# Patient Record
Sex: Male | Born: 1965
Health system: Southern US, Community
[De-identification: ages and names within clinical notes are randomized; demographics above are authoritative.]

## PROBLEM LIST (undated history)

## (undated) DIAGNOSIS — E785 Hyperlipidemia, unspecified: Secondary | ICD-10-CM

## (undated) DIAGNOSIS — D709 Neutropenia, unspecified: Secondary | ICD-10-CM

## (undated) HISTORY — DX: Neutropenia, unspecified: D70.9

## (undated) HISTORY — PX: COLONOSCOPY: SHX174

## (undated) HISTORY — DX: Hyperlipidemia, unspecified: E78.5

---

## 2011-01-23 ENCOUNTER — Other Ambulatory Visit: Payer: Self-pay | Admitting: Gastroenterology

## 2011-01-23 DIAGNOSIS — R1013 Epigastric pain: Secondary | ICD-10-CM

## 2011-02-12 ENCOUNTER — Ambulatory Visit
Admission: RE | Admit: 2011-02-12 | Discharge: 2011-02-12 | Disposition: A | Discharge status: Home / Self Care | Payer: 59 | Source: Ambulatory Visit | Attending: Gastroenterology | Admitting: Gastroenterology

## 2011-02-12 DIAGNOSIS — R1013 Epigastric pain: Secondary | ICD-10-CM

## 2011-07-27 ENCOUNTER — Other Ambulatory Visit: Payer: Self-pay | Admitting: Gastroenterology

## 2011-07-27 DIAGNOSIS — K769 Liver disease, unspecified: Secondary | ICD-10-CM

## 2011-07-29 ENCOUNTER — Other Ambulatory Visit: Payer: 59

## 2011-09-01 ENCOUNTER — Ambulatory Visit
Admission: RE | Admit: 2011-09-01 | Discharge: 2011-09-01 | Disposition: A | Discharge status: Home / Self Care | Payer: 59 | Source: Ambulatory Visit | Attending: Gastroenterology | Admitting: Gastroenterology

## 2011-09-01 DIAGNOSIS — K769 Liver disease, unspecified: Secondary | ICD-10-CM

## 2012-11-22 ENCOUNTER — Encounter: Payer: Self-pay | Admitting: Family Medicine

## 2012-11-22 ENCOUNTER — Ambulatory Visit (INDEPENDENT_AMBULATORY_CARE_PROVIDER_SITE_OTHER): Payer: 59 | Admitting: Family Medicine

## 2012-11-22 VITALS — Ht 76.0 in | Wt 171.0 lb

## 2012-11-22 DIAGNOSIS — F509 Eating disorder, unspecified: Secondary | ICD-10-CM

## 2012-11-22 NOTE — Progress Notes (Signed)
Medical Nutrition Therapy:  Appt start time: 1100 end time:  1200.  Assessment:  Primary concerns today: disordered eating.  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  He lost ~30 lb over the last couple of years, related to a relationship he was in with a woman who was anorexic.  He is no longer in that relationship, but retains many of the disordered eating behaviors and thoughts that he started having from that time.  He recognizes this is unhealthy, and he is unhappy with his preoccupation with food, which has started to affect his social life.   Usual eating pattern includes 3 meals (bkfst & lunch) and 1 snack per day. Usual physical activity includes bicycling 30 min to 3 hrs (20-50 mi) 6 X wk, 7-min high-intensity calisthenic workout, and yoga 2 X wk at [employer].   David Stuart's mom died in 2013/01/17and for weeks he had been staying awake thru much of the night.  He has not been able to get back on a regular sleep schedule.   Frequent foods include lentils, beans, tofu, unsweetened tea (mostly low-caffeine, decaf), fresh veg's ([employer]).  Avoided foods include flesh foods.    David Stuart is more focused on appearance than on weight.  He would like to weigh more than he is now, but doesn't have a # in mind.    24-hr recall: (Up at 1:30 AM) B (1:30 AM)-   3/4 c dry steel-cut oats, 1/4 c almonds, 1 apple, herb tea Back to bed till 6 AM Snk ( AM)-   tea L (12 PM)-  1 block tofu, 1 c lentils, 1/2 avocado, tea, water Snk ( PM)-  none D (4:30)-  1 block tofu, 1 c lentils, 1/2 avocado, water Snk (8:45)-  1/2 c dry popcorn, 2 apples, 1/4 c almonds  Progress Towards Goal(s):  In progress.   Nutritional Diagnosis:  NB-1.2 Harmful beliefs/attitudes about food or nutrition-related topics (use with caution) As related to nutritional value of foods.  As evidenced by plaguing thoughts, guilt, and food preoccupation.    Intervention:  Nutrition counseling.  Monitoring/Evaluation:  Dietary  intake, exercise, and body weight in 4 week(s).

## 2012-11-22 NOTE — Patient Instructions (Addendum)
-   Aim for no more than 5 hours between eating during the day.  - RDA for protein = 0.8 g per kg body weight.  171 lb / 2.2 = 78 g; 78 X 0.8 = 62 g.    - Recommend:  More than this if you are especially physically active, 65-75 g per day.    - Any time you exercise for more than an hour, you need some carb replacement.    - The best absorption of carb's is with a solution that is 6-10% carb.  (Divide total g of carb by total volume of drink, mL X 100).  - You also get the best absorption with DIFFERENT kinds of carb's as well as some protein (up to 15 g)   -Recommended protein:  Pro-stat.    - Consensus:  We can burn up to 60 g carb per hour.    - The best recovery nutrition is within 30 min of exercise.   - Journal some about those negative food thoughts.  Write down counter-arguments to those thoughts.   - AT FOLLOW-UP WE'LL TALK THESE THOUGHTS AND YOUR RESPONSE.    - Satiety & joy from eating are very important parts of being a "competent eater."  - Resources:  [employer].

## 2012-12-20 ENCOUNTER — Encounter: Payer: Self-pay | Admitting: Family Medicine

## 2012-12-20 ENCOUNTER — Ambulatory Visit (INDEPENDENT_AMBULATORY_CARE_PROVIDER_SITE_OTHER): Payer: 59 | Admitting: Family Medicine

## 2012-12-20 VITALS — Ht 76.0 in | Wt 169.7 lb

## 2012-12-20 DIAGNOSIS — F509 Eating disorder, unspecified: Secondary | ICD-10-CM

## 2012-12-20 NOTE — Progress Notes (Signed)
Medical Nutrition Therapy:  Appt start time: 1600 end time:  1700.  Assessment:  Primary concerns today: disordered eating.  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  He has been journaling about food thoughts and counter-arguments, which has been helpful to him in dealing with plaguing food thoughts or guilt about not exercising enough.  He has been trying to eat more meals with others to get away from isolating and to branch out in his food choices.  He has been trying to get protein as recommended at last visit; usually gets 60 g from 2 cakes of tofu/day plus additional protein in lentils and [place] yogurt.  Hayzen will not been able to ride as often as he'd like b/c of the school schedule and also b/c of fatigue related to still not sleeping well.  He has been averaging 2 hrs riding per day (1-3 hrs @ ~16 mph), but anticipates less now that school is in session.    24-hr recall:  (UP at 1 AM) B (1 AM)-  oatmeal, 1 c dry, 1 c plain [place] yogurt, 1/4 c walnuts, herb tea Back to bed ~2 AM till 5:30; walked the dog.  Snk (6 AM)-  Herb tea L ( PM)-  none Snk (4 PM)-  1 c lentils, steamed kale, baked eggplant & 1 cake tofu, avocado, water D (7 PM)-  1 c lentils, steamed kale, baked eggplant & 1 cake tofu, avocado, herb tea Snk (10 PM)-  4 tbsp kernels air-popped popcorn w/ salt & cumin, herb tea Kiegan has not usually been going 14 hours w/out eating, but yesterday was the first day of the semester, and he has not yet settled into a routine.  York Spaniel he just was busy all day, and did not think about food.    Progress Towards Goal(s):  In progress.   Nutritional Diagnosis:  NB-1.2 Harmful beliefs/attitudes about food or nutrition-related topics (use with caution) As related to nutritional value of foods.  As evidenced by plaguing thoughts, guilt, and food preoccupation.    Intervention:  Nutrition counseling.  Monitoring/Evaluation:  Dietary intake, exercise, and body  weight in 4 week(s).

## 2012-12-20 NOTE — Patient Instructions (Addendum)
-   Self-compassion.org:  Take the quiz.    - Email Jeannie your overall score and subscores.   - Include some sort of starch with each dinner:  winter squash, beets, sweet potato, white potatoes. - Getting your sleep regulated and consistent will help you regulate your eating.   - For any ride exercise >60 minutes, it's best to obtain at least 45 grams of carb per hour, and to follow this exercise with at least 15 grams of protein.   - Main goals for the next few weeks:  - Three meals and at least one snack per day with no more than 5 hours between eating.    - Track the number of hours of sleep per night, noting if it is interrupted or straight through.

## 2012-12-28 ENCOUNTER — Telehealth: Payer: Self-pay | Admitting: Internal Medicine

## 2012-12-28 NOTE — Telephone Encounter (Signed)
C/D 12/28/12 for appt. 01/19/13

## 2013-01-17 ENCOUNTER — Ambulatory Visit: Payer: 59 | Admitting: Family Medicine

## 2013-01-19 ENCOUNTER — Ambulatory Visit: Payer: 59

## 2013-02-07 ENCOUNTER — Other Ambulatory Visit: Payer: Self-pay | Admitting: Internal Medicine

## 2013-02-07 ENCOUNTER — Ambulatory Visit: Payer: 59

## 2013-02-07 ENCOUNTER — Telehealth: Payer: Self-pay

## 2013-02-07 NOTE — Telephone Encounter (Signed)
Pt will call back to reschedule appt

## 2013-02-08 ENCOUNTER — Telehealth: Payer: Self-pay | Admitting: Internal Medicine

## 2013-02-08 NOTE — Telephone Encounter (Signed)
Called referring office and notified of cancel np appts.

## 2013-02-14 ENCOUNTER — Ambulatory Visit: Payer: 59 | Admitting: Family Medicine

## 2013-03-14 ENCOUNTER — Ambulatory Visit: Payer: 59 | Admitting: Family Medicine

## 2015-11-18 DIAGNOSIS — F509 Eating disorder, unspecified: Secondary | ICD-10-CM | POA: Diagnosis not present

## 2015-11-18 DIAGNOSIS — Z0000 Encounter for general adult medical examination without abnormal findings: Secondary | ICD-10-CM | POA: Diagnosis not present

## 2015-11-18 DIAGNOSIS — Z8042 Family history of malignant neoplasm of prostate: Secondary | ICD-10-CM | POA: Diagnosis not present

## 2016-04-29 DIAGNOSIS — H524 Presbyopia: Secondary | ICD-10-CM | POA: Diagnosis not present

## 2016-04-29 DIAGNOSIS — H5203 Hypermetropia, bilateral: Secondary | ICD-10-CM | POA: Diagnosis not present

## 2016-06-05 DIAGNOSIS — Z23 Encounter for immunization: Secondary | ICD-10-CM | POA: Diagnosis not present

## 2017-05-07 DIAGNOSIS — Z0000 Encounter for general adult medical examination without abnormal findings: Secondary | ICD-10-CM | POA: Diagnosis not present

## 2017-05-25 DIAGNOSIS — Z0000 Encounter for general adult medical examination without abnormal findings: Secondary | ICD-10-CM | POA: Diagnosis not present

## 2017-05-25 DIAGNOSIS — Z13220 Encounter for screening for lipoid disorders: Secondary | ICD-10-CM | POA: Diagnosis not present

## 2017-07-06 ENCOUNTER — Encounter: Payer: Self-pay | Admitting: Oncology

## 2017-07-06 ENCOUNTER — Telehealth: Payer: Self-pay | Admitting: Oncology

## 2017-07-06 NOTE — Telephone Encounter (Signed)
Appt has been scheduled for the pt to see Dr. Clelia CroftShadad on 3/29 at 11am. Letter mailed to the pt with the appt date and time.

## 2017-07-23 ENCOUNTER — Encounter: Payer: Self-pay | Admitting: Oncology

## 2017-08-04 ENCOUNTER — Inpatient Hospital Stay: Payer: BLUE CROSS/BLUE SHIELD | Attending: Oncology | Admitting: Oncology

## 2017-08-04 ENCOUNTER — Inpatient Hospital Stay: Payer: BLUE CROSS/BLUE SHIELD

## 2017-08-04 VITALS — BP 109/67 | HR 48 | Temp 98.0°F | Resp 19 | Ht 76.0 in | Wt 177.2 lb

## 2017-08-04 DIAGNOSIS — D709 Neutropenia, unspecified: Secondary | ICD-10-CM | POA: Diagnosis not present

## 2017-08-04 DIAGNOSIS — D509 Iron deficiency anemia, unspecified: Secondary | ICD-10-CM

## 2017-08-04 DIAGNOSIS — D649 Anemia, unspecified: Secondary | ICD-10-CM

## 2017-08-04 DIAGNOSIS — Z800 Family history of malignant neoplasm of digestive organs: Secondary | ICD-10-CM | POA: Insufficient documentation

## 2017-08-04 LAB — CBC WITH DIFFERENTIAL (CANCER CENTER ONLY)
BASOS ABS: 0 10*3/uL (ref 0.0–0.1)
BASOS PCT: 0 %
Eosinophils Absolute: 0.2 10*3/uL (ref 0.0–0.5)
Eosinophils Relative: 7 %
HEMATOCRIT: 38.4 % (ref 38.4–49.9)
HEMOGLOBIN: 11.8 g/dL — AB (ref 13.0–17.1)
Lymphocytes Relative: 55 %
Lymphs Abs: 1.6 10*3/uL (ref 0.9–3.3)
MCH: 23.6 pg — ABNORMAL LOW (ref 27.2–33.4)
MCHC: 30.9 g/dL — ABNORMAL LOW (ref 32.0–36.0)
MCV: 76.5 fL — ABNORMAL LOW (ref 79.3–98.0)
Monocytes Absolute: 0.5 10*3/uL (ref 0.1–0.9)
Monocytes Relative: 16 %
NEUTROS ABS: 0.7 10*3/uL — AB (ref 1.5–6.5)
NEUTROS PCT: 22 %
Platelet Count: 195 10*3/uL (ref 140–400)
RBC: 5.01 MIL/uL (ref 4.20–5.82)
RDW: 14.5 % (ref 11.0–14.6)
WBC: 3 10*3/uL — AB (ref 4.0–10.3)

## 2017-08-04 LAB — CMP (CANCER CENTER ONLY)
ALBUMIN: 4 g/dL (ref 3.5–5.0)
ALK PHOS: 63 U/L (ref 40–150)
ALT: 25 U/L (ref 0–55)
AST: 57 U/L — ABNORMAL HIGH (ref 5–34)
Anion gap: 9 (ref 3–11)
BILIRUBIN TOTAL: 0.4 mg/dL (ref 0.2–1.2)
BUN: 19 mg/dL (ref 7–26)
CO2: 28 mmol/L (ref 22–29)
Calcium: 9.8 mg/dL (ref 8.4–10.4)
Chloride: 104 mmol/L (ref 98–109)
Creatinine: 0.79 mg/dL (ref 0.70–1.30)
GFR, Est AFR Am: >60 mL/min (ref >60)
GFR, Estimated: >60 mL/min (ref >60)
Glucose, Bld: 83 mg/dL (ref 70–140)
POTASSIUM: 4.8 mmol/L (ref 3.5–5.1)
Sodium: 141 mmol/L (ref 136–145)
TOTAL PROTEIN: 8.5 g/dL — AB (ref 6.4–8.3)

## 2017-08-04 LAB — IRON AND TIBC
Iron: 75 ug/dL (ref 45–182)
Saturation Ratios: 20 % (ref 17.9–39.5)
TIBC: 374 ug/dL (ref 250–450)
UIBC: 299 ug/dL

## 2017-08-04 LAB — VITAMIN B12: VITAMIN B 12: 165 pg/mL — AB (ref 180–914)

## 2017-08-04 LAB — FERRITIN: Ferritin: 43 ng/mL (ref 24–336)

## 2017-08-04 NOTE — Progress Notes (Signed)
Reason for Referral: Neutropenia  HPI: 52 year old gentleman native of [place] currently resides in [place].  He is a rather healthy gentleman without any significant comorbid conditions that has been told that he has neutropenia for the last 25 years.  He gets routine medical care at his primary care physician including periodic CBCs.  His last CBC done in January 2019 showed a white cell count of 2.3, hemoglobin 11.8 and MCV of 76.4.  His RDW was 14.2.  His neutrophil percentage was 18.6 with slightly increased lymphocytes at 54.  His chemistries including electrolytes, kidney function and liver function all within normal range.  Previous CBC done in 2014 showed a white cell count of 2.4, hemoglobin of 10.0 and MCV of 79.2.  His platelet count was 78 with a slightly decreased neutrophil count at 800.  He denies any symptoms related to this issue without any recurrent infections or hospitalizations.  He denies any constitutional symptoms or weight loss.  His performance status and activity level remain excellent.  He denies any joint pain or swelling.  He does not report any headaches, blurry vision, syncope or seizures. Does not report any fevers, chills or sweats.  Does not report any cough, wheezing or hemoptysis.  Does not report any chest pain, palpitation, orthopnea or leg edema.  Does not report any nausea, vomiting or abdominal pain.  Does not report any constipation or diarrhea.  Does not report any skeletal complaints.    Does not report frequency, urgency or hematuria.  Does not report any skin rashes or lesions. Does not report any heat or cold intolerance.  Does not report any lymphadenopathy or petechiae.  Does not report any anxiety or depression.  Remaining review of systems is negative.    His past medical history is unremarkable for hypertension, coronary disease or diabetes.   Current Outpatient Medications: He does not take any medication. .  .    No family history of  blood disorders.  His father had colon cancer.  Social History   Socioeconomic History  . Marital status: Unknown    Spouse name: Not on file  . Number of children: Not on file  . Years of education: Not on file  . Highest education level: Not on file  Occupational History  . Not on file  Social Needs  . Financial resource strain: Not on file  . Food insecurity:    Worry: Not on file    Inability: Not on file  . Transportation needs:    Medical: Not on file    Non-medical: Not on file  Tobacco Use  . Smoking status: Never Smoker  . Smokeless tobacco: Never Used  Substance and Sexual Activity  . Alcohol use: No  . Drug use: Not on file  . Sexual activity: Not on file  Lifestyle  . Physical activity:    Days per week: Not on file    Minutes per session: Not on file  . Stress: Not on file  Relationships  . Social connections:    Talks on phone: Not on file    Gets together: Not on file    Attends religious service: Not on file    Active member of club or organization: Not on file    Attends meetings of clubs or organizations: Not on file    Relationship status: Not on file  . Intimate partner violence:    Fear of current or ex partner: Not on file    Emotionally abused: Not on file  Physically abused: Not on file    Forced sexual activity: Not on file  Other Topics Concern  . Not on file  Social History Narrative  . Not on file  :  Pertinent items are noted in HPI.  Exam: Blood pressure 109/67, pulse (!) 48, temperature 98 F (36.7 C), temperature source Oral, resp. rate 19, height 6\' 4"  (1.93 m), weight 177 lb 3.2 oz (80.4 kg), SpO2 100 %.  ECOG 0 General appearance: alert and cooperative appeared without distress. Head: atraumatic without any abnormalities. Eyes: conjunctivae/corneas clear. PERRL.  Sclera anicteric. Throat: lips, mucosa, and tongue normal; without oral thrush or ulcers. Resp: clear to auscultation bilaterally without rhonchi, wheezes or  dullness to percussion. Cardio: regular rate and rhythm, S1, S2 normal, no murmur, click, rub or gallop GI: soft, non-tender; bowel sounds normal; no masses,  no organomegaly Skin: Skin color, texture, turgor normal. No rashes or lesions Lymph nodes: Cervical, supraclavicular, and axillary nodes normal. Neurologic: Grossly normal without any motor, sensory or deep tendon reflexes. Musculoskeletal: No joint deformity or effusion.    Assessment and Plan:   52 year old gentleman with the following issues:  1.  Neutropenia: This is been diagnosed for the last 25 years and has been fluctuating since that time.  The etiology is likely related to congenital causes versus benign fluctuating ethnic variation.  Hematological disorder is considered unlikely at this time given the chronicity and the mild nature of his neutropenia with an ANC that have ranged close to 1000.  Autoimmune disorder is also considered and likely.  I have recommended continued observation and surveillance for the time being and I will repeat his CBC as well as a peripheral smear today.  Flow cytometry can be used in the future if he has lymphocytosis.  2.  Microcytic anemia: He does report history of blood donations in the past although none recently.  I will obtain iron studies to ensure her iron adequacy.  He is up-to-date on his colonoscopies.  He does have family history of colon cancer.  3.  Follow-up: To be determined after his laboratory testing is back.  30  minutes was spent with the patient face-to-face today.  More than 50% of time was dedicated to patient counseling, education and answering questions regarding his diagnosis.

## 2017-09-19 ENCOUNTER — Emergency Department (HOSPITAL_COMMUNITY): Payer: BLUE CROSS/BLUE SHIELD

## 2017-09-19 ENCOUNTER — Encounter (HOSPITAL_COMMUNITY): Payer: Self-pay

## 2017-09-19 ENCOUNTER — Emergency Department (HOSPITAL_COMMUNITY)
Admission: EM | Admit: 2017-09-19 | Discharge: 2017-09-19 | Disposition: A | Discharge status: Home / Self Care | Payer: BLUE CROSS/BLUE SHIELD | Attending: Emergency Medicine | Admitting: Emergency Medicine

## 2017-09-19 ENCOUNTER — Other Ambulatory Visit: Payer: Self-pay

## 2017-09-19 DIAGNOSIS — R079 Chest pain, unspecified: Secondary | ICD-10-CM | POA: Diagnosis not present

## 2017-09-19 DIAGNOSIS — R0789 Other chest pain: Secondary | ICD-10-CM | POA: Diagnosis not present

## 2017-09-19 DIAGNOSIS — I213 ST elevation (STEMI) myocardial infarction of unspecified site: Secondary | ICD-10-CM | POA: Diagnosis not present

## 2017-09-19 LAB — BASIC METABOLIC PANEL
Anion gap: 7 (ref 5–15)
BUN: 16 mg/dL (ref 6–20)
CHLORIDE: 102 mmol/L (ref 101–111)
CO2: 28 mmol/L (ref 22–32)
Calcium: 9.4 mg/dL (ref 8.9–10.3)
Creatinine, Ser: 0.75 mg/dL (ref 0.61–1.24)
GFR calc Af Amer: >60 mL/min (ref >60)
GFR calc non Af Amer: >60 mL/min (ref >60)
Glucose, Bld: 92 mg/dL (ref 65–99)
POTASSIUM: 4.4 mmol/L (ref 3.5–5.1)
SODIUM: 137 mmol/L (ref 135–145)

## 2017-09-19 LAB — CBC
HEMATOCRIT: 39 % (ref 39.0–52.0)
HEMOGLOBIN: 11.9 g/dL — AB (ref 13.0–17.0)
MCH: 23.4 pg — ABNORMAL LOW (ref 26.0–34.0)
MCHC: 30.5 g/dL (ref 30.0–36.0)
MCV: 76.6 fL — AB (ref 78.0–100.0)
Platelets: 192 10*3/uL (ref 150–400)
RBC: 5.09 MIL/uL (ref 4.22–5.81)
RDW: 14.8 % (ref 11.5–15.5)
WBC: 3.1 10*3/uL — ABNORMAL LOW (ref 4.0–10.5)

## 2017-09-19 LAB — I-STAT TROPONIN, ED: Troponin i, poc: 0 ng/mL (ref 0.00–0.08)

## 2017-09-19 MED ORDER — GI COCKTAIL ~~LOC~~
30.0000 mL | Freq: Once | ORAL | Status: AC
Start: 2017-09-19 — End: 2017-09-19
  Administered 2017-09-19: 30 mL via ORAL
  Filled 2017-09-19: qty 30

## 2017-09-19 NOTE — ED Triage Notes (Signed)
Pt presents for evaluation of left sided CP x 2-3 days. Reports he initially thought it may be gas but has not resolved. Pt went to PCP after hours clinic today and had EKG was sent here for further evaluation.

## 2017-09-19 NOTE — ED Notes (Signed)
Dr. Sherrie Mustache did not call [facility], pt in lobby, stated, need to go back next

## 2017-09-19 NOTE — ED Provider Notes (Signed)
MSE was initiated and I personally evaluated the patient and placed orders (if any) at  12:15 PM on Sep 19, 2017.  The patient appears stable so that the remainder of the MSE may be completed by another provider. Patient has had intermittent left-sided anterior chest pain for the past 2 days. Pain is improved with standing. Not worse with walking. Patient also reports he rides his bicycle approximately 120 miles per week without ever having had any chest pain. He has no associated lightheadedness shortness of breath sweatiness or nausea with chest pain that he's had for the past 2 days. Chest discomfort is mild presently.He sought [facility] EKG was performed which was noted to be abnormal. Sent here for further evaluation.  On exam alert no distress lungs clear auscultation heart regular rate and rhythm no murmurs or rubs abdomen nondistended extremities without edema. Skin warm dry I showed today's EKG and EKG from clinic to Dr. Katrinka Blazing who is not convinced that EKG is consistent with STEMI. Symptoms are atypical for STEMI.   Doug Sou, MD 09/19/17 [phone]

## 2017-09-19 NOTE — ED Provider Notes (Signed)
[facility] EMERGENCY DEPARTMENT Provider Note  CSN: [id number] Arrival date & time: 09/19/17 1151  Chief Complaint(s) Chest Pain  HPI David Stuart is a 52 y.o. male healthy, athletic male who presents to the emergency department with intermittent left pectoral chest pressure that he first noted yesterday upon awakening.  Pain resolved once he got out of bed and went about his daily activity.  This included 2-1/2-hour bicycle ride.  There and this patient denied any recurrence of his pain.  He endorsed typical shortness of breath with his exercise but nothing out of the ordinary. Patient reports that this morning when he awoke the pain was there again which did improve mildly well going about his daily business. There was no associated nausea or vomiting.  No diaphoresis.  No abdominal pain.  Pain was not exertional.  It was nonradiating.    No history of hypertension or hyperlipidemia.  He is a non-smoker.  No illicit drug use.  No alcohol consumption.  Patient does report eating spicy food recently.  Denies any prior history of DVT.  No autoimmune disorders.  No recent travel or immobility.  HPI  Past Medical History History reviewed. No pertinent past medical history. Patient Active Problem List   Diagnosis Date Noted  . Eating disorder, unspecified 11/22/2012   Home Medication(s) Prior to Admission medications   Not on File                                                                                                                                    Past Surgical History History reviewed. No pertinent surgical history. Family History No family history on file.  Social History Social History   Tobacco Use  . Smoking status: Never Smoker  . Smokeless tobacco: Never Used  Substance Use Topics  . Alcohol use: No  . Drug use: Not on file   Allergies Patient has no known allergies.  Review of Systems Review of Systems All other systems are reviewed and are  negative for acute change except as noted in the HPI  Physical Exam Vital Signs  I have reviewed the triage vital signs BP 103/73   Pulse (!) 44   Temp 98.2 F (36.8 C) (Oral)   Resp 13   SpO2 100%   Physical Exam  Constitutional: He is oriented to person, place, and time. He appears well-developed and well-nourished. No distress.  Tall and thin  HENT:  Head: Normocephalic and atraumatic.  Nose: Nose normal.  Eyes: Pupils are equal, round, and reactive to light. Conjunctivae and EOM are normal. Right eye exhibits no discharge. Left eye exhibits no discharge. No scleral icterus.  Neck: Normal range of motion. Neck supple.  Cardiovascular: Regular rhythm. Bradycardia present. Exam reveals no gallop and no friction rub.  No murmur heard. Pulmonary/Chest: Effort normal and breath sounds normal. No stridor. No respiratory distress. He has no rales. He exhibits tenderness (mild).  Abdominal: Soft. He exhibits no distension. There is no tenderness.  Musculoskeletal: He exhibits no edema or tenderness.  Neurological: He is alert and oriented to person, place, and time.  Skin: Skin is warm and dry. No rash noted. He is not diaphoretic. No erythema.  Psychiatric: He has a normal mood and affect.  Vitals reviewed.   ED Results and Treatments Labs (all labs ordered are listed, but only abnormal results are displayed) Labs Reviewed  CBC - Abnormal; Notable for the following components:      Result Value   WBC 3.1 (*)    Hemoglobin 11.9 (*)    MCV 76.6 (*)    MCH 23.4 (*)    All other components within normal limits  BASIC METABOLIC PANEL  I-STAT TROPONIN, ED                                                                                                                         EKG  EKG Interpretation  Date/Time:  Sunday Sep 19 2017 11:57:29 EDT Ventricular Rate:  54 PR Interval:  196 QRS Duration: 88 QT Interval:  448 QTC Calculation: 424 R Axis:   74 Text  Interpretation:  Sinus bradycardia Possible Left atrial enlargement Left ventricular hypertrophy Nonspecific ST abnormality Abnormal ECG No old tracing to compare Reconfirmed by Drema Pry [phone]) on 09/19/2017 1:43:41 PM Also confirmed by Drema Pry [phone]), editor Barbette Hair [phone])  on 09/19/2017 3:49:08 PM      Radiology Dg Chest 2 View  Result Date: 09/19/2017 CLINICAL DATA:  Pt presents for evaluation of left sided CP x 2-3 days. Reports he initially thought it may be gas but has not resolved. Pt went to PCP after hours clinic today and had an abnormal EKG was sent here for further evaluation. EXAM: CHEST - 2 VIEW COMPARISON:  none FINDINGS: Lungs clear, mildly hyperinflated. Heart size and mediastinal contours are within normal limits. No effusion.  No pneumothorax. Visualized bones unremarkable. IMPRESSION: No acute cardiopulmonary disease. Electronically Signed   By: Corlis Leak M.D.   On: 09/19/2017 12:27   Pertinent labs & imaging results that were available during my care of the patient were reviewed by me and considered in my medical decision making (see chart for details).  Medications Ordered in ED Medications  gi cocktail (Maalox,Lidocaine,Donnatal) (30 mLs Oral Given 09/19/17 1530)  Procedures Procedures  (including critical care time)  Medical Decision Making / ED Course I have reviewed the nursing notes for this encounter and the patient's prior records (if available in EHR or on provided paperwork).    Highly atypical chest pain.  EKG with evidence of LVH.  Otherwise nonischemic without evidence of pericarditis.  Troponin negative.  I do not feel that this is cardiac in nature as the patient has gone through stress testing while cycling, causing no recurrence of his pain.  I am more suspicious for muscular strain versus GI  symptoms.  Low pretest probability for pulmonary embolism.  Presentation not classic for aortic dissection or esophageal perforation. Chest x-ray without evidence suggestive of pneumonia, pneumothorax, pneumomediastinum.  No abnormal contour of the mediastinum to suggest dissection. No evidence of acute injuries.  Patient provided with a GI cocktail resulting in complete resolution of his symptomatology.  The patient appears reasonably screened and/or stabilized for discharge and I doubt any other medical condition or other [facility] requiring further screening, evaluation, or treatment in the ED at this time prior to discharge.  The patient is safe for discharge with strict return precautions.   Final Clinical Impression(s) / ED Diagnoses Final diagnoses:  Atypical chest pain    Disposition: Discharge  Condition: Good  I have discussed the results, Dx and Tx plan with the patient who expressed understanding and agree(s) with the plan. Discharge instructions discussed at great length. The patient was given strict return precautions who verbalized understanding of the instructions. No further questions at time of discharge.    ED Discharge Orders    None       Follow Up: [name], FNP [address] [place] [address] [phone]  Schedule an appointment as soon as possible for a visit  As needed     This chart was dictated using voice recognition software.  Despite best efforts to proofread,  errors can occur which can change the documentation meaning.   Nira Conn, MD 09/19/17 [phone]

## 2017-11-14 DIAGNOSIS — J189 Pneumonia, unspecified organism: Secondary | ICD-10-CM | POA: Diagnosis not present

## 2017-11-14 DIAGNOSIS — R109 Unspecified abdominal pain: Secondary | ICD-10-CM | POA: Diagnosis not present

## 2017-11-14 DIAGNOSIS — R079 Chest pain, unspecified: Secondary | ICD-10-CM | POA: Diagnosis not present

## 2017-11-18 DIAGNOSIS — J181 Lobar pneumonia, unspecified organism: Secondary | ICD-10-CM | POA: Diagnosis not present

## 2017-12-12 DIAGNOSIS — R079 Chest pain, unspecified: Secondary | ICD-10-CM | POA: Diagnosis not present

## 2017-12-14 ENCOUNTER — Other Ambulatory Visit: Payer: Self-pay | Admitting: Family Medicine

## 2017-12-14 DIAGNOSIS — D709 Neutropenia, unspecified: Secondary | ICD-10-CM | POA: Diagnosis not present

## 2017-12-14 DIAGNOSIS — J189 Pneumonia, unspecified organism: Secondary | ICD-10-CM | POA: Diagnosis not present

## 2017-12-30 ENCOUNTER — Ambulatory Visit
Admission: RE | Admit: 2017-12-30 | Discharge: 2017-12-30 | Disposition: A | Discharge status: Home / Self Care | Payer: BLUE CROSS/BLUE SHIELD | Source: Ambulatory Visit | Attending: Family Medicine | Admitting: Family Medicine

## 2017-12-30 DIAGNOSIS — J189 Pneumonia, unspecified organism: Secondary | ICD-10-CM

## 2017-12-30 DIAGNOSIS — J181 Lobar pneumonia, unspecified organism: Secondary | ICD-10-CM | POA: Diagnosis not present

## 2017-12-31 ENCOUNTER — Ambulatory Visit
Admission: RE | Admit: 2017-12-31 | Discharge: 2017-12-31 | Disposition: A | Discharge status: Home / Self Care | Payer: BLUE CROSS/BLUE SHIELD | Source: Ambulatory Visit | Attending: Family Medicine | Admitting: Family Medicine

## 2017-12-31 ENCOUNTER — Other Ambulatory Visit: Payer: Self-pay

## 2017-12-31 ENCOUNTER — Encounter: Payer: Self-pay | Admitting: Radiology

## 2017-12-31 ENCOUNTER — Inpatient Hospital Stay (HOSPITAL_COMMUNITY)
Admission: EM | Admit: 2017-12-31 | Discharge: 2018-01-02 | DRG: 175 | Disposition: A | Discharge status: Home / Self Care | Payer: BLUE CROSS/BLUE SHIELD | Attending: Internal Medicine | Admitting: Internal Medicine

## 2017-12-31 ENCOUNTER — Other Ambulatory Visit: Payer: Self-pay | Admitting: Family Medicine

## 2017-12-31 DIAGNOSIS — Z820 Family history of epilepsy and other diseases of the nervous system: Secondary | ICD-10-CM | POA: Diagnosis not present

## 2017-12-31 DIAGNOSIS — Z8546 Personal history of malignant neoplasm of prostate: Secondary | ICD-10-CM

## 2017-12-31 DIAGNOSIS — J189 Pneumonia, unspecified organism: Secondary | ICD-10-CM | POA: Diagnosis present

## 2017-12-31 DIAGNOSIS — R9389 Abnormal findings on diagnostic imaging of other specified body structures: Secondary | ICD-10-CM

## 2017-12-31 DIAGNOSIS — Z8042 Family history of malignant neoplasm of prostate: Secondary | ICD-10-CM

## 2017-12-31 DIAGNOSIS — D649 Anemia, unspecified: Secondary | ICD-10-CM

## 2017-12-31 DIAGNOSIS — I2699 Other pulmonary embolism without acute cor pulmonale: Secondary | ICD-10-CM | POA: Diagnosis not present

## 2017-12-31 DIAGNOSIS — R0789 Other chest pain: Secondary | ICD-10-CM | POA: Diagnosis not present

## 2017-12-31 DIAGNOSIS — Z800 Family history of malignant neoplasm of digestive organs: Secondary | ICD-10-CM

## 2017-12-31 DIAGNOSIS — I82492 Acute embolism and thrombosis of other specified deep vein of left lower extremity: Secondary | ICD-10-CM | POA: Diagnosis present

## 2017-12-31 DIAGNOSIS — Z832 Family history of diseases of the blood and blood-forming organs and certain disorders involving the immune mechanism: Secondary | ICD-10-CM

## 2017-12-31 DIAGNOSIS — D528 Other folate deficiency anemias: Secondary | ICD-10-CM | POA: Diagnosis not present

## 2017-12-31 DIAGNOSIS — J181 Lobar pneumonia, unspecified organism: Secondary | ICD-10-CM | POA: Diagnosis not present

## 2017-12-31 DIAGNOSIS — I340 Nonrheumatic mitral (valve) insufficiency: Secondary | ICD-10-CM | POA: Diagnosis not present

## 2017-12-31 LAB — COMPREHENSIVE METABOLIC PANEL
ALBUMIN: 3.9 g/dL (ref 3.5–5.0)
ALK PHOS: 41 U/L (ref 38–126)
ALT: 18 U/L (ref 0–44)
ANION GAP: 11 (ref 5–15)
AST: 29 U/L (ref 15–41)
BUN: 15 mg/dL (ref 6–20)
CHLORIDE: 102 mmol/L (ref 98–111)
CO2: 25 mmol/L (ref 22–32)
Calcium: 9.5 mg/dL (ref 8.9–10.3)
Creatinine, Ser: 0.81 mg/dL (ref 0.61–1.24)
Glucose, Bld: 85 mg/dL (ref 70–99)
POTASSIUM: 4.4 mmol/L (ref 3.5–5.1)
Sodium: 138 mmol/L (ref 135–145)
Total Bilirubin: 0.6 mg/dL (ref 0.3–1.2)
Total Protein: 8.6 g/dL — ABNORMAL HIGH (ref 6.5–8.1)

## 2017-12-31 LAB — CBC
HCT: 36.6 % — ABNORMAL LOW (ref 39.0–52.0)
Hemoglobin: 11.1 g/dL — ABNORMAL LOW (ref 13.0–17.0)
MCH: 23 pg — AB (ref 26.0–34.0)
MCHC: 30.3 g/dL (ref 30.0–36.0)
MCV: 75.9 fL — ABNORMAL LOW (ref 78.0–100.0)
Platelets: 302 10*3/uL (ref 150–400)
RBC: 4.82 MIL/uL (ref 4.22–5.81)
RDW: 15.5 % (ref 11.5–15.5)
WBC: 3.8 10*3/uL — ABNORMAL LOW (ref 4.0–10.5)

## 2017-12-31 LAB — TROPONIN I

## 2017-12-31 LAB — ANTITHROMBIN III: ANTITHROMB III FUNC: 97 % (ref 75–120)

## 2017-12-31 LAB — I-STAT TROPONIN, ED: TROPONIN I, POC: 0.01 ng/mL (ref 0.00–0.08)

## 2017-12-31 MED ORDER — HEPARIN (PORCINE) IN NACL 100-0.45 UNIT/ML-% IJ SOLN
1300.0000 [IU]/h | INTRAMUSCULAR | Status: DC
  Administered 2017-12-31 – 2018-01-01 (×2): 1300 [IU]/h via INTRAVENOUS
  Filled 2017-12-31 (×2): qty 250

## 2017-12-31 MED ORDER — IOPAMIDOL (ISOVUE-370) INJECTION 76%
75.0000 mL | Freq: Once | INTRAVENOUS | Status: AC | PRN
Start: 2017-12-31 — End: 2017-12-31
  Administered 2017-12-31: 75 mL via INTRAVENOUS

## 2017-12-31 MED ORDER — ACETAMINOPHEN 650 MG RE SUPP: 650.0000 mg | Freq: Four times a day (QID) | RECTAL | Status: DC | PRN

## 2017-12-31 MED ORDER — HEPARIN BOLUS VIA INFUSION
5000.0000 [IU] | Freq: Once | INTRAVENOUS | Status: AC
  Administered 2017-12-31: 5000 [IU] via INTRAVENOUS
  Filled 2017-12-31: qty 5000

## 2017-12-31 MED ORDER — ACETAMINOPHEN 325 MG PO TABS: 650.0000 mg | ORAL_TABLET | Freq: Four times a day (QID) | ORAL | Status: DC | PRN

## 2017-12-31 MED ORDER — SODIUM CHLORIDE 0.9 % IV SOLN
INTRAVENOUS | Status: AC
  Administered 2017-12-31: via INTRAVENOUS

## 2017-12-31 NOTE — H&P (Signed)
TRH H&P   Patient Demographics:    David Stuart, is a 52 y.o. male  MRN: [id number]   DOB - 12/06/1965  Admit Date - 12/31/2017  Outpatient Primary MD for the patient is [name], FNP  Referring MD/NP/PA: Lyndel Safe  Outpatient Specialists:    Patient coming from: home  No chief complaint on file.     HPI:    David Stuart  is a 52 y.o. male, w + family hx of colon/ prostate cancer apparently c/o using levaquin in end of 10/2017 for pnemonia,and felt better  then pt had a repeat CXR which showed pneumonia and tx with cefdinir starting in 11/2017.  Pt denies recent travel or new medication or weight loss.   + sob. Denies fever, chills, cough, hemoptysis, palp, n/v, diarrhea, brbpr, black stool.   Since finishing abx, pt has felt right sided chest pain,  Had CT chest yesterday and had f/u of CTA chest today.    CTA chest IMPRESSION: 1. Convincing segmental pulmonary embolus to the superior segment of the right lower lobe, without associated lung consolidation. There is an equivocal small subsegmental branch pulmonary embolus adjacent to 1 of the areas of peripheral lung consolidation in the left lower lobe. There are no other pulmonary emboli. Given these limited embolic findings, the areas of lung consolidation described on the previous day's CT, are unlikely to reflect infarction. These are most likely multifocal areas of infection. 2. No new lung abnormalities.  Wbc 3.8, Hgb 11.1, Plt 302 Na 138, K 4.4, Bun 15, Creatinnie 0.81 Ast 29, Alt 18 Trop 0.01   Pt will be admitted for PE, and pneumonia.     Review of systems:    In addition to the HPI above, No Fever-chills, No Headache, No changes with Vision or hearing, No problems swallowing food or Liquids,  No Abdominal pain, No Nausea or Vommitting, Bowel movements are regular, No Blood in stool or  Urine, No dysuria, No new skin rashes or bruises, No new joints pains-aches,  No new weakness, tingling, numbness in any extremity, No recent weight gain or loss, No polyuria, polydypsia or polyphagia, No significant Mental Stressors.  A full 10 point Review of Systems was done, except as stated above, all other Review of Systems were negative.   With Past History of the following :    History reviewed. No pertinent past medical history.    Past Surgical History:  Procedure Laterality Date  . COLONOSCOPY        Social History:     Social History   Tobacco Use  . Smoking status: Never Smoker  . Smokeless tobacco: Never Used  Substance Use Topics  . Alcohol use: No     Lives - at home  Mobility - walks by self   Family History :     Family History  Problem Relation Age of Onset  .  Parkinson's disease Mother   . Lupus Mother   . Prostate cancer Father        died at age 103 from prostate cancer  . Colon cancer Father        Home Medications:   Prior to Admission medications   Medication Sig Start Date End Date Taking? Authorizing Provider  naproxen sodium (ALEVE) 220 MG tablet Take 220-440 mg by mouth 2 (two) times daily as needed (for pain).   Yes [provider]  ranitidine (ZANTAC) 75 MG tablet Take 75 mg by mouth daily as needed for heartburn.   Yes [provider]     Allergies:    No Known Allergies   Physical Exam:   Vitals  Blood pressure 111/75, pulse (!) 57, temperature 98.3 F (36.8 C), temperature source Oral, resp. rate 11, SpO2 100 %.   1. General   lying in bed in NAD,    2. Normal affect and insight, Not Suicidal or Homicidal, Awake Alert, Oriented X 3.  3. No F.N deficits, ALL C.Nerves Intact, Strength 5/5 all 4 extremities, Sensation intact all 4 extremities, Plantars down going.  4. Ears and Eyes appear Normal, Conjunctivae clear, PERRLA. Moist Oral Mucosa.  5. Supple Neck, No JVD, No cervical  lymphadenopathy appriciated, No Carotid Bruits.  6. Symmetrical Chest wall movement, slight crackles left lung base, no wheezing.   7. RRR, No Gallops, Rubs or Murmurs, No Parasternal Heave.  8. Positive Bowel Sounds, Abdomen Soft, No tenderness, No organomegaly appriciated,No rebound -guarding or rigidity.  9.  No Cyanosis, Normal Skin Turgor, No Skin Rash or Bruise.  10. Good muscle tone,  joints appear normal , no effusions, Normal ROM.  11. No Palpable Lymph Nodes in Neck or Axillae    EKG nsr at 70, nl axis, + lvh   Data Review:    CBC Recent Labs  Lab 12/31/17 1635  WBC 3.8*  HGB 11.1*  HCT 36.6*  PLT 302  MCV 75.9*  MCH 23.0*  MCHC 30.3  RDW 15.5   ------------------------------------------------------------------------------------------------------------------  Chemistries  Recent Labs  Lab 12/31/17 1635  NA 138  K 4.4  CL 102  CO2 25  GLUCOSE 85  BUN 15  CREATININE 0.81  CALCIUM 9.5  AST 29  ALT 18  ALKPHOS 41  BILITOT 0.6   ------------------------------------------------------------------------------------------------------------------ CrCl cannot be calculated (Unknown ideal weight.). ------------------------------------------------------------------------------------------------------------------ No results for input(s): TSH, T4TOTAL, T3FREE, THYROIDAB in the last 72 hours.  Invalid input(s): FREET3  Coagulation profile No results for input(s): INR, PROTIME in the last 168 hours. ------------------------------------------------------------------------------------------------------------------- No results for input(s): DDIMER in the last 72 hours. -------------------------------------------------------------------------------------------------------------------  Cardiac Enzymes No results for input(s): CKMB, TROPONINI, MYOGLOBIN in the last 168 hours.  Invalid input(s):  CK ------------------------------------------------------------------------------------------------------------------ No results found for: BNP   ---------------------------------------------------------------------------------------------------------------  Urinalysis No results found for: COLORURINE, APPEARANCEUR, LABSPEC, PHURINE, GLUCOSEU, HGBUR, BILIRUBINUR, KETONESUR, PROTEINUR, UROBILINOGEN, NITRITE, LEUKOCYTESUR  ----------------------------------------------------------------------------------------------------------------   Imaging Results:    Ct Chest Wo Contrast  Result Date: 12/30/2017 CLINICAL DATA:  Persistent pneumonias since July 2019. EXAM: CT CHEST WITHOUT CONTRAST TECHNIQUE: Multidetector CT imaging of the chest was performed following the standard protocol without IV contrast. COMPARISON:  Chest radiographs dated 12/12/2017, 11/14/2017 and 09/19/2017. FINDINGS: Cardiovascular: Coronary artery calcifications.  Normal sized heart. Mediastinum/Nodes: No enlarged mediastinal or axillary lymph nodes. Thyroid gland, trachea, and esophagus demonstrate no significant findings. Lungs/Pleura: Oval area dense peripheral consolidation and possible liquefaction in the posterolateral right lower lobe, measuring 5.1 x 2.0 cm on  image number 145 series 8. Similar area with some central gas or air in the posterolateral aspect of the left lower lobe measuring 4.0 x 2.4 cm on image number 148 series 8. Similar area posteromedially in the left lower lobe, measuring 4.4 x 0.9 cm on image number 152 series 8. Small similar area posteriorly at the left lung base in the left lower lobe on image number 158, measuring 1.2 x 0.6 cm. Area of probable subsegmental atelectasis in the medial aspect of the right middle lobe and similar area along the major fissure in the right lower lobe on image number 142 series 8. 4 mm right lower lobe nodule on image number 120 series 8. Upper Abdomen: Unremarkable.  Musculoskeletal: Unremarkable bones. IMPRESSION: 1. Oval area of dense consolidation and possible liquefaction in the posterolateral right lower lobe and 3 similar areas in the left lower lobe. These are concerning for areas of pulmonary infarction. Multifocal infection is also a possibility. These do not have the cavitation typically seen with septic emboli. Areas of rounded atelectasis are unlikely since these are multiple. Recommend a chest CTA to exclude pulmonary emboli. 2. 4 mm right lower lobe nodule. No follow-up needed if patient is low-risk. Non-contrast chest CT can be considered in 12 months if patient is high-risk. This recommendation follows the consensus statement: Guidelines for Management of Incidental Pulmonary Nodules Detected on CT Images: From the Fleischner Society 2017; Radiology 2017; 284:228-243. 3. Atheromatous coronary artery calcifications. These results will be called to the ordering clinician or representative by the Radiologist Assistant, and communication documented in the PACS or zVision Dashboard. Electronically Signed   By: Beckie Salts M.D.   On: 12/30/2017 17:32   Ct Angio Chest Pe W Or Wo Contrast  Result Date: 12/31/2017 CLINICAL DATA:  Abnormal CT yesterday. Follow-up exam. Questionable septic emboli. EXAM: CT ANGIOGRAPHY CHEST WITH CONTRAST TECHNIQUE: Multidetector CT imaging of the chest was performed using the standard protocol during bolus administration of intravenous contrast. Multiplanar CT image reconstructions and MIPs were obtained to evaluate the vascular anatomy. CONTRAST:  75mL ISOVUE-370 IOPAMIDOL (ISOVUE-370) INJECTION 76% COMPARISON:  CT of the chest on 12/30/2017 showing peripheral areas consolidation at both lung bases FINDINGS: Cardiovascular: There is satisfactory opacification of the pulmonary arteries to the segmental level. In a segmental branch to the right lower lobe, superior segment, there is occlusive thrombus, but no associated consolidation in  this portion of the lung. In a small subsegmental branch to the left lower lobe, just above the pleural based focus of left lower lobe consolidation, there is opacification of a peripheral pulmonary artery which could reflect a pulmonary embolism. There is no other evidence of a pulmonary embolus. Great vessels are normal in caliber. No aortic dissection or atherosclerosis. Minor left coronary artery calcifications. Heart is normal size.  No pericardial effusion. Mediastinum/Nodes: No enlarged mediastinal, hilar, or axillary lymph nodes. Thyroid gland, trachea, and esophagus demonstrate no significant findings. Lungs/Pleura: Peripheral areas of lower lobe lung consolidation are unchanged from the previous day's CT. Consolidation or scarring in the anterior inferior right middle lobe is stable. 4 mm right lower lobe nodule, image 104, series 11, also unchanged. No new areas of lung opacification. No evidence of pulmonary edema. No pleural effusion or pneumothorax. Upper Abdomen: No acute abnormality. Musculoskeletal: No chest wall abnormality. No acute or significant osseous findings. Review of the MIP images confirms the above findings. IMPRESSION: 1. Convincing segmental pulmonary embolus to the superior segment of the right lower lobe, without associated lung  consolidation. There is an equivocal small subsegmental branch pulmonary embolus adjacent to 1 of the areas of peripheral lung consolidation in the left lower lobe. There are no other pulmonary emboli. Given these limited embolic findings, the areas of lung consolidation described on the previous day's CT, are unlikely to reflect infarction. These are most likely multifocal areas of infection. 2. No new lung abnormalities. Electronically Signed   By: Amie Portland M.D.   On: 12/31/2017 15:24      Assessment & Plan:    Principal Problem:   Pulmonary embolism (HCC) Active Problems:   Community acquired pneumonia   Anemia    PE Tele Trop I q6h  x3 Check cardiac echo Heparin iv pharmacy to dose Check hypercoag panel Recommended routine screening for cancer as outpatient  CAP Blood culture x2 Sputum gram stain, culture Urine strep antigen, urine legionella antigen vanco iv, rocephin iv, zithromax iv Check cbc , cmp in am  Anemia Check cbc in am   DVT Prophylaxis Heparin -- SCDs   AM Labs Ordered, also please review Full Orders  Family Communication: Admission, patients condition and plan of care including tests being ordered have been discussed with the patient  who indicate understanding and agree with the plan and Code Status.  Code Status FULL CODE  Likely DC to  home  Condition GUARDED    Consults called: none  Admission status: inpatient   Time spent in minutes : 32   Pearson Grippe M.D on 12/31/2017 at 7:41 PM  Between 7am to 7pm - Pager - [phone]  After 7pm go to [email or web address] - password [email or web address]  [facility] - Office  [phone]

## 2017-12-31 NOTE — ED Triage Notes (Signed)
Patient reports being sent after CT confirming PE of the right lower lobe. Pt states he was placed on antibiotics since July after diagnosed with pneumonia. No difficulty breathing today, only pain is described as "gas pain."

## 2017-12-31 NOTE — ED Provider Notes (Signed)
Patient placed in Quick Look pathway, seen and evaluated   Chief Complaint:Positive PE scan.   HPI:   Patient has been having breathing difficulties since July.   He is here today with a positive PE scan which also shows multi focal pneumonia.  He denies CP or ShOB.  Has bilateral upper abdomen "gas pain"  ROS: No fever or chills.   Physical Exam:   Gen: No distress  Neuro: Awake and Alert  Skin: Warm    Focused Exam: No respiratory distress at baseline.    Ct Chest Wo Contrast  Result Date: 12/30/2017 CLINICAL DATA:  Persistent pneumonias since July 2019. EXAM: CT CHEST WITHOUT CONTRAST TECHNIQUE: Multidetector CT imaging of the chest was performed following the standard protocol without IV contrast. COMPARISON:  Chest radiographs dated 12/12/2017, 11/14/2017 and 09/19/2017. FINDINGS: Cardiovascular: Coronary artery calcifications.  Normal sized heart. Mediastinum/Nodes: No enlarged mediastinal or axillary lymph nodes. Thyroid gland, trachea, and esophagus demonstrate no significant findings. Lungs/Pleura: Oval area dense peripheral consolidation and possible liquefaction in the posterolateral right lower lobe, measuring 5.1 x 2.0 cm on image number 145 series 8. Similar area with some central gas or air in the posterolateral aspect of the left lower lobe measuring 4.0 x 2.4 cm on image number 148 series 8. Similar area posteromedially in the left lower lobe, measuring 4.4 x 0.9 cm on image number 152 series 8. Small similar area posteriorly at the left lung base in the left lower lobe on image number 158, measuring 1.2 x 0.6 cm. Area of probable subsegmental atelectasis in the medial aspect of the right middle lobe and similar area along the major fissure in the right lower lobe on image number 142 series 8. 4 mm right lower lobe nodule on image number 120 series 8. Upper Abdomen: Unremarkable. Musculoskeletal: Unremarkable bones. IMPRESSION: 1. Oval area of dense consolidation and possible  liquefaction in the posterolateral right lower lobe and 3 similar areas in the left lower lobe. These are concerning for areas of pulmonary infarction. Multifocal infection is also a possibility. These do not have the cavitation typically seen with septic emboli. Areas of rounded atelectasis are unlikely since these are multiple. Recommend a chest CTA to exclude pulmonary emboli. 2. 4 mm right lower lobe nodule. No follow-up needed if patient is low-risk. Non-contrast chest CT can be considered in 12 months if patient is high-risk. This recommendation follows the consensus statement: Guidelines for Management of Incidental Pulmonary Nodules Detected on CT Images: From the Fleischner Society 2017; Radiology 2017; 284:228-243. 3. Atheromatous coronary artery calcifications. These results will be called to the ordering clinician or representative by the Radiologist Assistant, and communication documented in the PACS or zVision Dashboard. Electronically Signed   By: Beckie Salts M.D.   On: 12/30/2017 17:32   Ct Angio Chest Pe W Or Wo Contrast  Result Date: 12/31/2017 CLINICAL DATA:  Abnormal CT yesterday. Follow-up exam. Questionable septic emboli. EXAM: CT ANGIOGRAPHY CHEST WITH CONTRAST TECHNIQUE: Multidetector CT imaging of the chest was performed using the standard protocol during bolus administration of intravenous contrast. Multiplanar CT image reconstructions and MIPs were obtained to evaluate the vascular anatomy. CONTRAST:  75mL ISOVUE-370 IOPAMIDOL (ISOVUE-370) INJECTION 76% COMPARISON:  CT of the chest on 12/30/2017 showing peripheral areas consolidation at both lung bases FINDINGS: Cardiovascular: There is satisfactory opacification of the pulmonary arteries to the segmental level. In a segmental branch to the right lower lobe, superior segment, there is occlusive thrombus, but no associated consolidation in this portion of  the lung. In a small subsegmental branch to the left lower lobe, just above the  pleural based focus of left lower lobe consolidation, there is opacification of a peripheral pulmonary artery which could reflect a pulmonary embolism. There is no other evidence of a pulmonary embolus. Great vessels are normal in caliber. No aortic dissection or atherosclerosis. Minor left coronary artery calcifications. Heart is normal size.  No pericardial effusion. Mediastinum/Nodes: No enlarged mediastinal, hilar, or axillary lymph nodes. Thyroid gland, trachea, and esophagus demonstrate no significant findings. Lungs/Pleura: Peripheral areas of lower lobe lung consolidation are unchanged from the previous day's CT. Consolidation or scarring in the anterior inferior right middle lobe is stable. 4 mm right lower lobe nodule, image 104, series 11, also unchanged. No new areas of lung opacification. No evidence of pulmonary edema. No pleural effusion or pneumothorax. Upper Abdomen: No acute abnormality. Musculoskeletal: No chest wall abnormality. No acute or significant osseous findings. Review of the MIP images confirms the above findings. IMPRESSION: 1. Convincing segmental pulmonary embolus to the superior segment of the right lower lobe, without associated lung consolidation. There is an equivocal small subsegmental branch pulmonary embolus adjacent to 1 of the areas of peripheral lung consolidation in the left lower lobe. There are no other pulmonary emboli. Given these limited embolic findings, the areas of lung consolidation described on the previous day's CT, are unlikely to reflect infarction. These are most likely multifocal areas of infection. 2. No new lung abnormalities. Electronically Signed   By: Amie Portland M.D.   On: 12/31/2017 15:24     Initiation of care has begun. The patient has been counseled on the process, plan, and necessity for staying for the completion/evaluation, and the remainder of the medical screening examination    Norman Clay 12/31/17 1638    Margarita Grizzle, MD 01/03/18 2120

## 2017-12-31 NOTE — ED Provider Notes (Signed)
Emergency Department Provider Note   I have reviewed the triage vital signs and the nursing notes.   HISTORY  Chief Complaint No chief complaint on file.   HPI David Stuart is a 52 y.o. male presents to the emergency department for evaluation of intermittent chest pain and presumed infection over the past 2 months.  Patient states his symptoms began in July with the right lateral chest wall pain.  He completed 2 courses of 5-day Levaquin symptoms did not completely resolved.  He then developed left lateral chest wall pain and was started on Cefdinir.  He completed approximately 2 weeks of this, again with no significant relief in symptoms.  His PCP sent him for a noncontrast CT yesterday which showed concern for possible lung infarction.  He was sent back for CT [facility] today which showed two areas of PE with some associated infectious process concern.  The patient has not had fevers and chills.  He has had some generalized weakness.  He is not experiencing any shortness of breath with his chest pain.  No syncope.  No recent [place] trips, surgeries.  No family history of hypercoagulable disorders.   History reviewed. No pertinent past medical history.  Patient Active Problem List   Diagnosis Date Noted  . Pulmonary embolism (HCC) 12/31/2017  . Community acquired pneumonia 12/31/2017  . Anemia 12/31/2017  . Eating disorder, unspecified 11/22/2012    Past Surgical History:  Procedure Laterality Date  . COLONOSCOPY        Allergies Patient has no known allergies.  Family History  Problem Relation Age of Onset  . Parkinson's disease Mother   . Lupus Mother   . Prostate cancer Father        died at age 53 from prostate cancer  . Colon cancer Father     Social History Social History   Tobacco Use  . Smoking status: Never Smoker  . Smokeless tobacco: Never Used  Substance Use Topics  . Alcohol use: No  . Drug use: Not on file    Review of Systems  Constitutional: No  fever/chills Eyes: No visual changes. ENT: No sore throat. Cardiovascular: Positive chest pain. Respiratory: Denies shortness of breath. Gastrointestinal: No abdominal pain.  No nausea, no vomiting.  No diarrhea.  No constipation. Genitourinary: Negative for dysuria. Musculoskeletal: Negative for back pain. Skin: Negative for rash. Neurological: Negative for headaches, focal weakness or numbness.  10-point ROS otherwise negative.  ____________________________________________   PHYSICAL EXAM:  VITAL SIGNS: ED Triage Vitals  Enc Vitals Group     BP 12/31/17 1623 111/75     Pulse Rate 12/31/17 1623 82     Resp 12/31/17 1623 16     Temp 12/31/17 1623 98.3 F (36.8 C)     Temp Source 12/31/17 1623 Oral     SpO2 12/31/17 1623 100 %   Constitutional: Alert and oriented. Well appearing and in no acute distress. Eyes: Conjunctivae are normal.  Head: Atraumatic. Nose: No congestion/rhinnorhea. Mouth/Throat: Mucous membranes are moist.  Neck: No stridor.  Cardiovascular: Normal rate, regular rhythm. Good peripheral circulation. Grossly normal heart sounds.   Respiratory: Normal respiratory effort.  No retractions. Lungs CTAB. Gastrointestinal: Soft and nontender. No distention.  Musculoskeletal: No lower extremity tenderness nor edema. No gross deformities of extremities. Neurologic:  Normal speech and language. No gross focal neurologic deficits are appreciated.  Skin:  Skin is warm, dry and intact. No rash noted.  ____________________________________________   LABS (all labs ordered are listed, but only  abnormal results are displayed)  Labs Reviewed  CBC - Abnormal; Notable for the following components:      Result Value   WBC 3.8 (*)    Hemoglobin 11.1 (*)    HCT 36.6 (*)    MCV 75.9 (*)    MCH 23.0 (*)    All other components within normal limits  COMPREHENSIVE METABOLIC PANEL - Abnormal; Notable for the following components:   Total Protein 8.6 (*)    All other  components within normal limits  MRSA PCR SCREENING  ANTITHROMBIN III  TROPONIN I  PROTEIN C ACTIVITY  PROTEIN C, TOTAL  PROTEIN S ACTIVITY  PROTEIN S, TOTAL  LUPUS ANTICOAGULANT PANEL  BETA-2-GLYCOPROTEIN I ABS, IGG/M/A  HOMOCYSTEINE  FACTOR 5 LEIDEN  PROTHROMBIN GENE MUTATION  CARDIOLIPIN ANTIBODIES, IGG, IGM, IGA  HEPARIN LEVEL (UNFRACTIONATED)  CBC  HIV ANTIBODY (ROUTINE TESTING)  COMPREHENSIVE METABOLIC PANEL  TROPONIN I  TROPONIN I  I-STAT TROPONIN, ED   ____________________________________________  EKG   EKG Interpretation  Date/Time:  Friday December 31 2017 16:21:16 EDT Ventricular Rate:  72 PR Interval:  162 QRS Duration: 90 QT Interval:  406 QTC Calculation: 444 R Axis:   77 Text Interpretation:  Normal sinus rhythm with sinus arrhythmia Right atrial enlargement Moderate voltage criteria for LVH, may be normal variant Borderline ECG No STEMI.  Confirmed by Alona Bene [id number]) on 12/31/2017 6:42:40 PM       ____________________________________________  RADIOLOGY  Ct Angio Chest Pe W Or Wo Contrast  Result Date: 12/31/2017 CLINICAL DATA:  Abnormal CT yesterday. Follow-up exam. Questionable septic emboli. EXAM: CT ANGIOGRAPHY CHEST WITH CONTRAST TECHNIQUE: Multidetector CT imaging of the chest was performed using the standard protocol during bolus administration of intravenous contrast. Multiplanar CT image reconstructions and MIPs were obtained to evaluate the vascular anatomy. CONTRAST:  75mL ISOVUE-370 IOPAMIDOL (ISOVUE-370) INJECTION 76% COMPARISON:  CT of the chest on 12/30/2017 showing peripheral areas consolidation at both lung bases FINDINGS: Cardiovascular: There is satisfactory opacification of the pulmonary arteries to the segmental level. In a segmental branch to the right lower lobe, superior segment, there is occlusive thrombus, but no associated consolidation in this portion of the lung. In a small subsegmental branch to the left lower lobe, just  above the pleural based focus of left lower lobe consolidation, there is opacification of a peripheral pulmonary artery which could reflect a pulmonary embolism. There is no other evidence of a pulmonary embolus. Great vessels are normal in caliber. No aortic dissection or atherosclerosis. Minor left coronary artery calcifications. Heart is normal size.  No pericardial effusion. Mediastinum/Nodes: No enlarged mediastinal, hilar, or axillary lymph nodes. Thyroid gland, trachea, and esophagus demonstrate no significant findings. Lungs/Pleura: Peripheral areas of lower lobe lung consolidation are unchanged from the previous day's CT. Consolidation or scarring in the anterior inferior right middle lobe is stable. 4 mm right lower lobe nodule, image 104, series 11, also unchanged. No new areas of lung opacification. No evidence of pulmonary edema. No pleural effusion or pneumothorax. Upper Abdomen: No acute abnormality. Musculoskeletal: No chest wall abnormality. No acute or significant osseous findings. Review of the MIP images confirms the above findings. IMPRESSION: 1. Convincing segmental pulmonary embolus to the superior segment of the right lower lobe, without associated lung consolidation. There is an equivocal small subsegmental branch pulmonary embolus adjacent to 1 of the areas of peripheral lung consolidation in the left lower lobe. There are no other pulmonary emboli. Given these limited embolic findings, the areas of lung consolidation  described on the previous day's CT, are unlikely to reflect infarction. These are most likely multifocal areas of infection. 2. No new lung abnormalities. Electronically Signed   By: Amie Portland M.D.   On: 12/31/2017 15:24    ____________________________________________   PROCEDURES  Procedure(s) performed:   Procedures  CRITICAL CARE Performed by: Maia Plan Total critical care time: 35 minutes Critical care time was exclusive of separately billable  procedures and treating other patients. Critical care was necessary to treat or prevent imminent or life-threatening deterioration. Critical care was time spent personally by me on the following activities: development of treatment plan with patient and/or surrogate as well as nursing, discussions with consultants, evaluation of patient's response to treatment, examination of patient, obtaining history from patient or surrogate, ordering and performing treatments and interventions, ordering and review of laboratory studies, ordering and review of radiographic studies, pulse oximetry and re-evaluation of patient's condition.  Alona Bene, MD Emergency Medicine  ____________________________________________   INITIAL IMPRESSION / ASSESSMENT AND PLAN / ED COURSE  Pertinent labs & imaging results that were available during my care of the patient were reviewed by me and considered in my medical decision making (see chart for details).  Patient presents to the ED for evaluation of positive CTA for PE. Patient with no SOB or fever symptoms. Starting heparin and will admit. Hold abx for now.   Discussed patient's case with Hospitalist, Dr. Selena Batten to request admission. Patient and family (if present) updated with plan. Care transferred to Hospitalist service.  I reviewed all nursing notes, vitals, pertinent old records, EKGs, labs, imaging (as available).  ____________________________________________  FINAL CLINICAL IMPRESSION(S) / ED DIAGNOSES  Final diagnoses:  Acute pulmonary embolism without acute cor pulmonale, unspecified pulmonary embolism type (HCC)     MEDICATIONS GIVEN DURING THIS VISIT:  Medications  heparin ADULT infusion 100 units/mL (25000 units/261mL sodium chloride 0.45%) (1,300 Units/hr Intravenous New Bag/Given 12/31/17 2000)  0.9 %  sodium chloride infusion ( Intravenous New Bag/Given 12/31/17 2354)  acetaminophen (TYLENOL) tablet 650 mg (has no administration in time range)     Or  acetaminophen (TYLENOL) suppository 650 mg (has no administration in time range)  heparin bolus via infusion 5,000 Units (5,000 Units Intravenous Bolus from Bag 12/31/17 2000)    Note:  This document was prepared using Dragon voice recognition software and may include unintentional dictation errors.  Alona Bene, MD Emergency Medicine    Sharin Altidor, Arlyss Repress, MD 01/01/18 [id number]

## 2017-12-31 NOTE — Progress Notes (Signed)
ANTICOAGULATION CONSULT NOTE - Initial Consult  Pharmacy Consult for heparin Indication: pulmonary embolus  No Known Allergies  Patient Measurements:  Actual body weight: 83 kg (patient reported) Ideal body weight: 86.8 kg  Heparin Dosing Weight: 83 kg   Vital Signs: Temp: 98.3 F (36.8 C) (09/06 1623) Temp Source: Oral (09/06 1623) BP: 111/75 (09/06 1900) Pulse Rate: 57 (09/06 1900)  Labs: Recent Labs    12/31/17 1635  HGB 11.1*  HCT 36.6*  PLT 302  CREATININE 0.81    CrCl cannot be calculated (Unknown ideal weight.).   Medical History: History reviewed. No pertinent past medical history.  Medications:   (Not in a hospital admission)  Assessment: 33 YOM with new segmental PE in R lower lobe to start IV heparin. He is not on any anticoagulation prior to admission. CBC reviewed.   Goal of Therapy:  Heparin level 0.3-0.7 units/ml Monitor platelets by anticoagulation protocol: Yes   Plan:  -Heparin 5000 units IV bolus, then start heparin infusion at 1300 units/hr -F/u 6 hr HL -Monitor daily HL, CBC and s/s of bleeding  Vinnie Level, PharmD., BCPS Clinical Pharmacist Clinical phone for 12/31/17 until 11pm: [phone]

## 2018-01-01 ENCOUNTER — Inpatient Hospital Stay (HOSPITAL_COMMUNITY): Payer: BLUE CROSS/BLUE SHIELD

## 2018-01-01 ENCOUNTER — Other Ambulatory Visit (HOSPITAL_COMMUNITY): Payer: BLUE CROSS/BLUE SHIELD

## 2018-01-01 DIAGNOSIS — I2699 Other pulmonary embolism without acute cor pulmonale: Secondary | ICD-10-CM

## 2018-01-01 DIAGNOSIS — I340 Nonrheumatic mitral (valve) insufficiency: Secondary | ICD-10-CM

## 2018-01-01 DIAGNOSIS — J181 Lobar pneumonia, unspecified organism: Secondary | ICD-10-CM

## 2018-01-01 DIAGNOSIS — J189 Pneumonia, unspecified organism: Secondary | ICD-10-CM | POA: Diagnosis present

## 2018-01-01 LAB — COMPREHENSIVE METABOLIC PANEL
ALBUMIN: 3.4 g/dL — AB (ref 3.5–5.0)
ALT: 17 U/L (ref 0–44)
AST: 27 U/L (ref 15–41)
Alkaline Phosphatase: 40 U/L (ref 38–126)
Anion gap: 8 (ref 5–15)
BILIRUBIN TOTAL: 0.5 mg/dL (ref 0.3–1.2)
BUN: 13 mg/dL (ref 6–20)
CO2: 27 mmol/L (ref 22–32)
Calcium: 8.9 mg/dL (ref 8.9–10.3)
Chloride: 104 mmol/L (ref 98–111)
Creatinine, Ser: 0.79 mg/dL (ref 0.61–1.24)
GFR calc Af Amer: >60 mL/min (ref >60)
GFR calc non Af Amer: >60 mL/min (ref >60)
GLUCOSE: 89 mg/dL (ref 70–99)
Potassium: 4.1 mmol/L (ref 3.5–5.1)
Sodium: 139 mmol/L (ref 135–145)
TOTAL PROTEIN: 7.4 g/dL (ref 6.5–8.1)

## 2018-01-01 LAB — TROPONIN I
Troponin I: <0.03 ng/mL (ref <0.03)
Troponin I: <0.03 ng/mL (ref <0.03)

## 2018-01-01 LAB — CBC
HCT: 33.6 % — ABNORMAL LOW (ref 39.0–52.0)
Hemoglobin: 10.3 g/dL — ABNORMAL LOW (ref 13.0–17.0)
MCH: 22.9 pg — ABNORMAL LOW (ref 26.0–34.0)
MCHC: 30.7 g/dL (ref 30.0–36.0)
MCV: 74.7 fL — ABNORMAL LOW (ref 78.0–100.0)
Platelets: 281 10*3/uL (ref 150–400)
RBC: 4.5 MIL/uL (ref 4.22–5.81)
RDW: 15.4 % (ref 11.5–15.5)
WBC: 4.2 10*3/uL (ref 4.0–10.5)

## 2018-01-01 LAB — ECHOCARDIOGRAM COMPLETE
Height: 76 in
Weight: 2895.96 oz

## 2018-01-01 LAB — MRSA PCR SCREENING: MRSA BY PCR: NEGATIVE

## 2018-01-01 LAB — HIV ANTIBODY (ROUTINE TESTING W REFLEX): HIV Screen 4th Generation wRfx: NONREACTIVE

## 2018-01-01 LAB — HEPARIN LEVEL (UNFRACTIONATED)
HEPARIN UNFRACTIONATED: 0.49 [IU]/mL (ref 0.30–0.70)
Heparin Unfractionated: 0.66 IU/mL (ref 0.30–0.70)

## 2018-01-01 MED ORDER — VANCOMYCIN HCL 10 G IV SOLR
1500.0000 mg | Freq: Once | INTRAVENOUS | Status: DC
  Administered 2018-01-01: 1500 mg via INTRAVENOUS
  Filled 2018-01-01: qty 1500

## 2018-01-01 MED ORDER — LEVOFLOXACIN 750 MG PO TABS
750.0000 mg | ORAL_TABLET | Freq: Every day | ORAL | Status: DC
  Administered 2018-01-01 – 2018-01-02 (×2): 750 mg via ORAL
  Filled 2018-01-01 (×2): qty 1

## 2018-01-01 MED ORDER — FAMOTIDINE 20 MG PO TABS: 20.0000 mg | ORAL_TABLET | Freq: Every day | ORAL | Status: DC | PRN

## 2018-01-01 MED ORDER — SODIUM CHLORIDE 0.9 % IV SOLN
500.0000 mg | INTRAVENOUS | Status: DC
  Administered 2018-01-01: 500 mg via INTRAVENOUS
  Filled 2018-01-01: qty 500

## 2018-01-01 MED ORDER — VANCOMYCIN HCL IN DEXTROSE 1-5 GM/200ML-% IV SOLN
1000.0000 mg | Freq: Two times a day (BID) | INTRAVENOUS | Status: DC
  Filled 2018-01-01: qty 200

## 2018-01-01 MED ORDER — SODIUM CHLORIDE 0.9 % IV SOLN
1.0000 g | INTRAVENOUS | Status: DC
  Administered 2018-01-01: 1 g via INTRAVENOUS
  Filled 2018-01-01: qty 10

## 2018-01-01 NOTE — Progress Notes (Signed)
Pharmacy Antibiotic Note  David Stuart is a 52 y.o. male admitted on 12/31/2017 with pneumonia.  Pharmacy has been consulted for vancomycin dosing.  Plan: Vancomycin 1500 mg IV x 1 then 1gm IV q12 hours F/u renal function, cultures and clinical course     Temp (24hrs), Avg:98 F (36.7 C), Min:97.7 F (36.5 C), Max:98.3 F (36.8 C)  Recent Labs  Lab 12/31/17 1635 01/01/18 0239  WBC 3.8* 4.2  CREATININE 0.81  --     CrCl cannot be calculated (Unknown ideal weight.).    No Known Allergies   Thank you for allowing pharmacy to be a part of this patient's care.  Talbert Cage [place] 01/01/2018 3:08 AM

## 2018-01-01 NOTE — Progress Notes (Addendum)
ANTICOAGULATION CONSULT NOTE   Pharmacy Consult for heparin Indication: pulmonary embolus  No Known Allergies  Patient Measurements: Height: 6\' 4"  (193 cm) Weight: 181 lb (82.1 kg) IBW/kg (Calculated) : 86.8  Actual body weight: 83 kg (patient reported) Heparin Dosing Weight: 83 kg   Vital Signs: Temp: 98.1 F (36.7 C) (09/07 0700) Temp Source: Oral (09/07 0700) BP: 95/67 (09/07 0700) Pulse Rate: 58 (09/07 0700)  Labs: Recent Labs    12/31/17 1635 12/31/17 2044 01/01/18 0239 01/01/18 0904  HGB 11.1*  --  10.3*  --   HCT 36.6*  --  33.6*  --   PLT 302  --  281  --   HEPARINUNFRC  --   --  0.49 0.66  CREATININE 0.81  --  0.79  --   TROPONINI  --  <0.03 <0.03 <0.03    Estimated Creatinine Clearance: 128.3 mL/min (by C-G formula based on SCr of 0.79 mg/dL).   Medical History: History reviewed. No pertinent past medical history.  Medications:  Medications Prior to Admission  Medication Sig Dispense Refill Last Dose  . naproxen sodium (ALEVE) 220 MG tablet Take 220-440 mg by mouth 2 (two) times daily as needed (for pain).   12/30/2017 at pm  . ranitidine (ZANTAC) 75 MG tablet Take 75 mg by mouth daily as needed for heartburn.   Past Month at Unknown time    Assessment: 59 YOM with new segmental PE in R lower lobe to start IV heparin. He is not on any anticoagulation prior to admission.   Confirmatory heparin level this morning 0.66 and remains within goal range. Renal function stable. Hgb trending down to 10.3 but platelets stable with no documented bleeding. No issues with the heparin infusion overnight or this morning per RN. Per MD progress note, the plan is to continue heparin infusion for another 24 hours then transition to Eliquis.   Goal of Therapy:  Heparin level 0.3-0.7 units/ml Monitor platelets by anticoagulation protocol: Yes   Plan:  -Continue heparin infusion at 1300 units/hr -Monitor daily HL, CBC and s/s of bleeding  Harlow Mares, PharmD PGY1  Pharmacy Resident Phone [phone]  01/01/2018   10:10 AM

## 2018-01-01 NOTE — Progress Notes (Signed)
  Echocardiogram 2D Echocardiogram has been performed.  David Stuart F 01/01/2018, 3:58 PM

## 2018-01-01 NOTE — Plan of Care (Signed)
Discussed plan of care for the evening with patient.  Emphasized the importance of using the call button when assistance is needed.  Good teach back displayed. 

## 2018-01-01 NOTE — Progress Notes (Signed)
ANTICOAGULATION CONSULT NOTE   Pharmacy Consult for heparin Indication: pulmonary embolus  No Known Allergies  Patient Measurements:  Actual body weight: 83 kg (patient reported) Ideal body weight: 86.8 kg  Heparin Dosing Weight: 83 kg   Vital Signs: Temp: 97.7 F (36.5 C) (09/07 0002) Temp Source: Oral (09/07 0002) BP: 108/67 (09/07 0002) Pulse Rate: 52 (09/07 0002)  Labs: Recent Labs    12/31/17 1635 12/31/17 2044 01/01/18 0239  HGB 11.1*  --  10.3*  HCT 36.6*  --  33.6*  PLT 302  --  281  HEPARINUNFRC  --   --  0.49  CREATININE 0.81  --   --   TROPONINI  --  <0.03  --     CrCl cannot be calculated (Unknown ideal weight.).   Medical History: History reviewed. No pertinent past medical history.  Medications:  Medications Prior to Admission  Medication Sig Dispense Refill Last Dose  . naproxen sodium (ALEVE) 220 MG tablet Take 220-440 mg by mouth 2 (two) times daily as needed (for pain).   12/30/2017 at pm  . ranitidine (ZANTAC) 75 MG tablet Take 75 mg by mouth daily as needed for heartburn.   Past Month at Unknown time    Assessment: 4 YOM with new segmental PE in R lower lobe to start IV heparin. He is not on any anticoagulation prior to admission. CBC reviewed.  Initial heparin level 0.49 units/ml Goal of Therapy:  Heparin level 0.3-0.7 units/ml Monitor platelets by anticoagulation protocol: Yes   Plan:  -Continue heparin infusion at 1300 units/hr -F/u 6 hr HL to confirm -Monitor daily HL, CBC and s/s of bleeding  Thanks for allowing pharmacy to be a part of this patient's care.  Talbert Cage, PharmD Clinical Pharmacist

## 2018-01-01 NOTE — Progress Notes (Signed)
@IPLOG @        PROGRESS NOTE                                                                                                                                                                                                             Patient Demographics:    David Stuart, is a 52 y.o. male, DOB - 1965/09/01, ZOX:[id number]  Admit date - 12/31/2017   Admitting Physician Pearson Grippe, MD  Outpatient Primary MD for the patient is [name], FNP  LOS - 1  Chief complaint.  Shortness of breath.   Brief Narrative   David Stuart  is a 52 y.o. male, w + family hx of colon/ prostate cancer apparently who had gone on long drives in the early part of July, he says he went up to [place], subsequently he developed some right lower lobe chest discomfort was diagnosed with pneumonia and treated unsuccessfully with couple of courses of antibiotics, his symptoms persisted and he developed some shortness of breath and was finally diagnosed with PE and admitted to the hospital.   Subjective:    [name] today has, No headache, No chest pain, No abdominal pain - No Nausea, No new weakness tingling or numbness, No Cough - SOB.     Assessment  & Plan :     1.  Right-sided pleuritic chest pain with shortness of breath.  Due to subacute PE, this likely happened in July after his long drive as per his history, he is currently on heparin drip and seems to be doing better, will continue heparin drip for 24 hours thereafter switch to Eliquis, increase activity and check echocardiogram along with lower extremity venous duplex.  2.  Possible atypical pneumonia.  Symptoms are not classic, no productive phlegm, no fever or leukocytosis, stop vancomycin and Rocephin, place on PO Levaquin x 5 days  monitor.   Note hypercoagulability ordered upon admission is pending.   Family Communication  :  None  Code Status :  Full  Disposition Plan  :  Home 1-2 days  Consults  :  None  Procedures  :     TTE  Venous US  CTA - 1. Convincing segmental pulmonary embolus to the superior segment of the right lower lobe, without associated lung consolidation. There is an equivocal small subsegmental branch pulmonary embolus adjacent to 1 of the areas of peripheral lung consolidation in the left lower lobe. There are no other pulmonary emboli. Given these limited embolic findings, the areas of lung consolidation described on  the previous day's CT, are unlikely to reflect infarction. These are most likely multifocal areas of infection. 2. No new lung abnormalities.   DVT Prophylaxis  :   Heparin    Lab Results  Component Value Date   PLT 281 01/01/2018    Diet :  Diet Order            Diet regular Room service appropriate? Yes; Fluid consistency: Thin  Diet effective now               Inpatient Medications Scheduled Meds: Continuous Infusions: . sodium chloride 50 mL/hr at 12/31/17 2354  . azithromycin 500 mg (01/01/18 0554)  . cefTRIAXone (ROCEPHIN)  IV 1 g (01/01/18 0448)  . heparin 1,300 Units/hr (01/01/18 0558)  . vancomycin 1,500 mg (01/01/18 0843)  . vancomycin     PRN Meds:.acetaminophen **OR** acetaminophen, famotidine  Antibiotics  :   Anti-infectives (From admission, onward)   Start     Dose/Rate Route Frequency Ordered Stop   01/01/18 1400  vancomycin (VANCOCIN) IVPB 1000 mg/200 mL premix     1,000 mg 200 mL/hr over 60 Minutes Intravenous Every 12 hours 01/01/18 0310     01/01/18 0315  cefTRIAXone (ROCEPHIN) 1 g in sodium chloride 0.9 % 100 mL IVPB     1 g 200 mL/hr over 30 Minutes Intravenous Every 24 hours 01/01/18 0302 01/08/18 0314   01/01/18 0315  azithromycin (ZITHROMAX) 500 mg in sodium chloride 0.9 % 250 mL IVPB     500 mg 250 mL/hr over 60 Minutes Intravenous Every 24 hours 01/01/18 0302 01/08/18 0314   01/01/18 0315  vancomycin (VANCOCIN) 1,500 mg in sodium chloride 0.9 % 500 mL IVPB     1,500 mg 250 mL/hr over 120 Minutes Intravenous  Once 01/01/18  0308            Objective:   Vitals:   01/01/18 0002 01/01/18 0421 01/01/18 0450 01/01/18 0700  BP: 108/67 109/69  95/67  Pulse: (!) 52 (!) 48  (!) 58  Resp: 15 20    Temp: 97.7 F (36.5 C) (!) 97.5 F (36.4 C)  98.1 F (36.7 C)  TempSrc: Oral Oral  Oral  SpO2: 100% 100%  100%  Weight:   82.1 kg   Height:   6\' 4"  (1.93 m)     Wt Readings from Last 3 Encounters:  01/01/18 82.1 kg  08/04/17 80.4 kg  12/20/12 77 kg     Intake/Output Summary (Last 24 hours) at 01/01/2018 0921 Last data filed at 01/01/2018 0300 Gross per 24 hour  Intake 140.28 ml  Output -  Net 140.28 ml     Physical Exam  Awake Alert, Oriented X 3, No new F.N deficits, Normal affect [place].AT,PERRAL Supple Neck,No JVD, No cervical lymphadenopathy appriciated.  Symmetrical Chest wall movement, Good air movement bilaterally, CTAB RRR,No Gallops,Rubs or new Murmurs, No Parasternal Heave +ve B.Sounds, Abd Soft, No tenderness, No organomegaly appriciated, No rebound - guarding or rigidity. No Cyanosis, Clubbing or edema, No new Rash or bruise     Data Review:    CBC Recent Labs  Lab 12/31/17 1635 01/01/18 0239  WBC 3.8* 4.2  HGB 11.1* 10.3*  HCT 36.6* 33.6*  PLT 302 281  MCV 75.9* 74.7*  MCH 23.0* 22.9*  MCHC 30.3 30.7  RDW 15.5 15.4    Chemistries  Recent Labs  Lab 12/31/17 1635 01/01/18 0239  NA 138 139  K 4.4 4.1  CL 102 104  CO2 25 27  GLUCOSE 85 89  BUN 15 13  CREATININE 0.81 0.79  CALCIUM 9.5 8.9  AST 29 27  ALT 18 17  ALKPHOS 41 40  BILITOT 0.6 0.5   ------------------------------------------------------------------------------------------------------------------ No results for input(s): CHOL, HDL, LDLCALC, TRIG, CHOLHDL, LDLDIRECT in the last 72 hours.  No results found for: HGBA1C ------------------------------------------------------------------------------------------------------------------ No results for input(s): TSH, T4TOTAL, T3FREE, THYROIDAB in the last 72  hours.  Invalid input(s): FREET3 ------------------------------------------------------------------------------------------------------------------ No results for input(s): VITAMINB12, FOLATE, FERRITIN, TIBC, IRON, RETICCTPCT in the last 72 hours.  Coagulation profile No results for input(s): INR, PROTIME in the last 168 hours.  No results for input(s): DDIMER in the last 72 hours.  Cardiac Enzymes Recent Labs  Lab 12/31/17 2044 01/01/18 0239  TROPONINI <0.03 <0.03   ------------------------------------------------------------------------------------------------------------------ No results found for: BNP  Micro Results Recent Results (from the past 240 hour(s))  MRSA PCR Screening     Status: None   Collection Time: 12/31/17  8:40 PM  Result Value Ref Range Status   MRSA by PCR NEGATIVE NEGATIVE Final    Comment:        The GeneXpert MRSA Assay (FDA approved for NASAL specimens only), is one component of a comprehensive MRSA colonization surveillance program. It is not intended to diagnose MRSA infection nor to guide or monitor treatment for MRSA infections. Performed at [facility], [address], [place], [place] [address]     Radiology Reports Ct Chest Wo Contrast  Result Date: 12/30/2017 CLINICAL DATA:  Persistent pneumonias since July 2019. EXAM: CT CHEST WITHOUT CONTRAST TECHNIQUE: Multidetector CT imaging of the chest was performed following the standard protocol without IV contrast. COMPARISON:  Chest radiographs dated 12/12/2017, 11/14/2017 and 09/19/2017. FINDINGS: Cardiovascular: Coronary artery calcifications.  Normal sized heart. Mediastinum/Nodes: No enlarged mediastinal or axillary lymph nodes. Thyroid gland, trachea, and esophagus demonstrate no significant findings. Lungs/Pleura: Oval area dense peripheral consolidation and possible liquefaction in the posterolateral right lower lobe, measuring 5.1 x 2.0 cm on image number 145 series 8. Similar  area with some central gas or air in the posterolateral aspect of the left lower lobe measuring 4.0 x 2.4 cm on image number 148 series 8. Similar area posteromedially in the left lower lobe, measuring 4.4 x 0.9 cm on image number 152 series 8. Small similar area posteriorly at the left lung base in the left lower lobe on image number 158, measuring 1.2 x 0.6 cm. Area of probable subsegmental atelectasis in the medial aspect of the right middle lobe and similar area along the major fissure in the right lower lobe on image number 142 series 8. 4 mm right lower lobe nodule on image number 120 series 8. Upper Abdomen: Unremarkable. Musculoskeletal: Unremarkable bones. IMPRESSION: 1. Oval area of dense consolidation and possible liquefaction in the posterolateral right lower lobe and 3 similar areas in the left lower lobe. These are concerning for areas of pulmonary infarction. Multifocal infection is also a possibility. These do not have the cavitation typically seen with septic emboli. Areas of rounded atelectasis are unlikely since these are multiple. Recommend a chest CTA to exclude pulmonary emboli. 2. 4 mm right lower lobe nodule. No follow-up needed if patient is low-risk. Non-contrast chest CT can be considered in 12 months if patient is high-risk. This recommendation follows the consensus statement: Guidelines for Management of Incidental Pulmonary Nodules Detected on CT Images: From the Fleischner Society 2017; Radiology 2017; 284:228-243. 3. Atheromatous coronary artery calcifications. These results will be called to the ordering clinician  or representative by the Radiologist Assistant, and communication documented in the PACS or zVision Dashboard. Electronically Signed   By: Beckie Salts M.D.   On: 12/30/2017 17:32   Ct Angio Chest Pe W Or Wo Contrast  Result Date: 12/31/2017 CLINICAL DATA:  Abnormal CT yesterday. Follow-up exam. Questionable septic emboli. EXAM: CT ANGIOGRAPHY CHEST WITH CONTRAST  TECHNIQUE: Multidetector CT imaging of the chest was performed using the standard protocol during bolus administration of intravenous contrast. Multiplanar CT image reconstructions and MIPs were obtained to evaluate the vascular anatomy. CONTRAST:  90mL ISOVUE-370 IOPAMIDOL (ISOVUE-370) INJECTION 76% COMPARISON:  CT of the chest on 12/30/2017 showing peripheral areas consolidation at both lung bases FINDINGS: Cardiovascular: There is satisfactory opacification of the pulmonary arteries to the segmental level. In a segmental branch to the right lower lobe, superior segment, there is occlusive thrombus, but no associated consolidation in this portion of the lung. In a small subsegmental branch to the left lower lobe, just above the pleural based focus of left lower lobe consolidation, there is opacification of a peripheral pulmonary artery which could reflect a pulmonary embolism. There is no other evidence of a pulmonary embolus. Great vessels are normal in caliber. No aortic dissection or atherosclerosis. Minor left coronary artery calcifications. Heart is normal size.  No pericardial effusion. Mediastinum/Nodes: No enlarged mediastinal, hilar, or axillary lymph nodes. Thyroid gland, trachea, and esophagus demonstrate no significant findings. Lungs/Pleura: Peripheral areas of lower lobe lung consolidation are unchanged from the previous day's CT. Consolidation or scarring in the anterior inferior right middle lobe is stable. 4 mm right lower lobe nodule, image 104, series 11, also unchanged. No new areas of lung opacification. No evidence of pulmonary edema. No pleural effusion or pneumothorax. Upper Abdomen: No acute abnormality. Musculoskeletal: No chest wall abnormality. No acute or significant osseous findings. Review of the MIP images confirms the above findings. IMPRESSION: 1. Convincing segmental pulmonary embolus to the superior segment of the right lower lobe, without associated lung consolidation. There is  an equivocal small subsegmental branch pulmonary embolus adjacent to 1 of the areas of peripheral lung consolidation in the left lower lobe. There are no other pulmonary emboli. Given these limited embolic findings, the areas of lung consolidation described on the previous day's CT, are unlikely to reflect infarction. These are most likely multifocal areas of infection. 2. No new lung abnormalities. Electronically Signed   By: Amie Portland M.D.   On: 12/31/2017 15:24    Time Spent in minutes  30   Susa Raring M.D on 01/01/2018 at 9:21 AM  To page go to [email or web address] - password [email or web address]

## 2018-01-01 NOTE — Progress Notes (Signed)
VASCULAR LAB PRELIMINARY  PRELIMINARY  PRELIMINARY  PRELIMINARY  Bilateral lower extremity venous duplex completed.    Preliminary report:  There is acute DVT noted in the left peroneal vein.   Yuma Blucher, RVT 01/01/2018, 5:19 PM

## 2018-01-02 DIAGNOSIS — D528 Other folate deficiency anemias: Secondary | ICD-10-CM

## 2018-01-02 LAB — CBC
HCT: 32 % — ABNORMAL LOW (ref 39.0–52.0)
HEMOGLOBIN: 9.8 g/dL — AB (ref 13.0–17.0)
MCH: 23 pg — ABNORMAL LOW (ref 26.0–34.0)
MCHC: 30.6 g/dL (ref 30.0–36.0)
MCV: 74.9 fL — ABNORMAL LOW (ref 78.0–100.0)
Platelets: 256 10*3/uL (ref 150–400)
RBC: 4.27 MIL/uL (ref 4.22–5.81)
RDW: 15.5 % (ref 11.5–15.5)
WBC: 4.1 10*3/uL (ref 4.0–10.5)

## 2018-01-02 LAB — PROTEIN C, TOTAL: PROTEIN C, TOTAL: 80 % (ref 60–150)

## 2018-01-02 LAB — HEPARIN LEVEL (UNFRACTIONATED): HEPARIN UNFRACTIONATED: 0.55 [IU]/mL (ref 0.30–0.70)

## 2018-01-02 MED ORDER — APIXABAN 5 MG PO TABS: 5.0000 mg | ORAL_TABLET | Freq: Two times a day (BID) | ORAL | 0 refills | Status: DC

## 2018-01-02 MED ORDER — APIXABAN 5 MG PO TABS: 10.0000 mg | ORAL_TABLET | Freq: Two times a day (BID) | ORAL | 0 refills | Status: DC

## 2018-01-02 MED ORDER — APIXABAN 5 MG PO TABS: 5.0000 mg | ORAL_TABLET | Freq: Two times a day (BID) | ORAL | Status: DC

## 2018-01-02 MED ORDER — APIXABAN 5 MG PO TABS
10.0000 mg | ORAL_TABLET | Freq: Two times a day (BID) | ORAL | Status: DC
  Administered 2018-01-02: 10 mg via ORAL
  Filled 2018-01-02: qty 2

## 2018-01-02 MED ORDER — LEVOFLOXACIN 750 MG PO TABS: 750.0000 mg | ORAL_TABLET | Freq: Every day | ORAL | 0 refills | Status: DC

## 2018-01-02 NOTE — Care Management (Signed)
Eliquis 30 day free and copay cards given.  Pt verbalized understanding to call pharmacy to check availability and to investigate copay.  Pt will see PCP in 1 week an discuss continued use of Eliquis.

## 2018-01-02 NOTE — Discharge Instructions (Signed)
Follow with Primary MD [name], FNP in 7 days   Get CBC, CMP, 2 view Chest X ray checked  by Primary MD in 5-7 days   Activity: As tolerated with Full fall precautions use walker/cane & assistance as needed  Disposition Home    Diet: Heart Healthy     Special Instructions: If you have smoked or chewed Tobacco  in the last 2 yrs please stop smoking, stop any regular Alcohol  and or any Recreational drug use.  On your next visit with your primary care physician please Get Medicines reviewed and adjusted.  Please request your Prim.MD to go over all Hospital Tests and Procedure/Radiological results at the follow up, please get all Hospital records sent to your Prim MD by signing hospital release before you go home.  If you experience worsening of your admission symptoms, develop shortness of breath, life threatening emergency, suicidal or homicidal thoughts you must seek medical attention immediately by calling 911 or calling your MD immediately  if symptoms less severe.  You Must read complete instructions/literature along with all the possible adverse reactions/side effects for all the Medicines you take and that have been prescribed to you. Take any new Medicines after you have completely understood and accpet all the possible adverse reactions/side effects.     Information on my medicine - ELIQUIS (apixaban)  This medication education was reviewed with me or my healthcare representative as part of my discharge preparation.    Why was Eliquis prescribed for you? Eliquis was prescribed to treat blood clots that may have been found in the veins of your legs (deep vein thrombosis) or in your lungs (pulmonary embolism) and to reduce the risk of them occurring again.  What do You need to know about Eliquis ? The starting dose is 10 mg (two 5 mg tablets) taken TWICE daily for the FIRST SEVEN (7) DAYS, then on  01/09/2018  the dose is reduced to ONE 5 mg tablet taken TWICE daily.   Eliquis may be taken with or without food.   Try to take the dose about the same time in the morning and in the evening. If you have difficulty swallowing the tablet whole please discuss with your pharmacist how to take the medication safely.  Take Eliquis exactly as prescribed and DO NOT stop taking Eliquis without talking to the doctor who prescribed the medication.  Stopping may increase your risk of developing a new blood clot.  Refill your prescription before you run out.  After discharge, you should have regular check-up appointments with your healthcare provider that is prescribing your Eliquis.    What do you do if you miss a dose? If a dose of ELIQUIS is not taken at the scheduled time, take it as soon as possible on the same day and twice-daily administration should be resumed. The dose should not be doubled to make up for a missed dose.  Important Safety Information A possible side effect of Eliquis is bleeding. You should call your healthcare provider right away if you experience any of the following: ? Bleeding from an injury or your nose that does not stop. ? Unusual colored urine (red or dark brown) or unusual colored stools (red or black). ? Unusual bruising for unknown reasons. ? A serious fall or if you hit your head (even if there is no bleeding).  Some medicines may interact with Eliquis and might increase your risk of bleeding or clotting while on Eliquis. To help avoid this, consult  your healthcare provider or pharmacist prior to using any new prescription or non-prescription medications, including herbals, vitamins, non-steroidal anti-inflammatory drugs (NSAIDs) and supplements.  This website has more information on Eliquis (apixaban): http://www.eliquis.com/eliquis/home

## 2018-01-02 NOTE — Progress Notes (Signed)
Pt discharged to home with wife, both educated on medications and appointments/follow-ups. All questions and concerns addressed to pt satisfaction. All belongings returned to pt. Pt left via wheelchair.

## 2018-01-02 NOTE — Progress Notes (Signed)
ANTICOAGULATION CONSULT NOTE - Initial Consult  Pharmacy Consult for apixaban Indication: pulmonary embolus  No Known Allergies  Patient Measurements: Height: 6\' 4"  (193 cm) Weight: 181 lb (82.1 kg) IBW/kg (Calculated) : 86.8  Vital Signs: Temp: 97.8 F (36.6 C) (09/08 0300) Temp Source: Oral (09/08 0300) BP: 110/69 (09/08 0300) Pulse Rate: 51 (09/08 0300)  Labs: Recent Labs    12/31/17 1635 12/31/17 2044 01/01/18 0239 01/01/18 0904 01/02/18 0105  HGB 11.1*  --  10.3*  --  9.8*  HCT 36.6*  --  33.6*  --  32.0*  PLT 302  --  281  --  256  HEPARINUNFRC  --   --  0.49 0.66 0.55  CREATININE 0.81  --  0.79  --   --   TROPONINI  --  <0.03 <0.03 <0.03  --     Estimated Creatinine Clearance: 128.3 mL/min (by C-G formula based on SCr of 0.79 mg/dL).   Medical History: History reviewed. No pertinent past medical history.  Medications:  Medications Prior to Admission  Medication Sig Dispense Refill Last Dose  . naproxen sodium (ALEVE) 220 MG tablet Take 220-440 mg by mouth 2 (two) times daily as needed (for pain).   12/30/2017 at pm  . ranitidine (ZANTAC) 75 MG tablet Take 75 mg by mouth daily as needed for heartburn.   Past Month at Unknown time   Scheduled:  . levofloxacin  750 mg Oral Daily   Infusions:  . heparin 1,300 Units/hr (01/01/18 1256)    Assessment: 52yo male admitted w/ acute segmental RLL PE to transition from UFH to DOAC.   Plan:  Will stop heparin and start Eliquis 10mg  PO BID x7d followed by 5mg  PO BID and begin anticoag education.  Vernard Gambles, PharmD, BCPS  01/02/2018,7:08 AM

## 2018-01-02 NOTE — Discharge Summary (Signed)
David Stuart MSX:[id number] DOB: [birth year] DOA: 12/31/2017  PCP: [name], FNP  Admit date: 12/31/2017  Discharge date: 01/02/2018  Admitted From: Home   Disposition:  Home   Recommendations for Outpatient Follow-up:   Follow up with PCP in 1-2 weeks  PCP Please obtain BMP/CBC, 2 view CXR in 1week,  (see Discharge instructions)   PCP Please follow up on the following pending results:   hypercoagulable panel ordered upon admission is still pending.   Home Health: None   Equipment/Devices: None  Consultations: None Discharge Condition: Stable   CODE STATUS: Full   Diet Recommendation: Heart Healthy     CC - SOB  Brief history of present illness from the day of admission and additional interim summary    VanceRicksis a51 y.o.male,w + family hx of colon/ prostate cancer apparently who had gone on long drives in the early part of July, he says he went up to [place], subsequently he developed some right lower lobe chest discomfort was diagnosed with pneumonia and treated unsuccessfully with couple of courses of antibiotics, his symptoms persisted and he developed some shortness of breath and was finally diagnosed with PE and admitted to the hospital.                                                                 Hospital Course    1.  Right-sided subacute PE & L.Leg Acute DVT -  this likely happened in July after his long drive as per his history, he was kept on heparin drip and did great, he is completely symptom-free, echocardiogram stable, venous duplex shows left lower extremity acute DVT, he has been switched to Eliquis, case management to give assistance with a 30-day coupon after per PCP, will be discharged home with PCP follow-up PCP can consider outpatient hematology follow-up if  needed.  Note - hypercoagulable panel ordered upon admission is still pending.   2.  Possible atypical pneumonia.  Symptoms are not classic, no productive phlegm, no fever or leukocytosis, stop vancomycin and Rocephin, place on PO Levaquin x 5 days , PCP to monitor.    Discharge diagnosis     Principal Problem:   Pulmonary embolism (HCC) Active Problems:   Community acquired pneumonia   Anemia   Pneumonia    Discharge instructions    Discharge Instructions    Diet - low sodium heart healthy   Complete by:  As directed    Discharge instructions   Complete by:  As directed    Follow with Primary MD [name], FNP in 7 days   Get CBC, CMP, 2 view Chest X ray checked  by Primary MD in 5-7 days   Activity: As tolerated with Full fall precautions use walker/cane & assistance as needed  Disposition Home  Diet: Heart Healthy     Special Instructions: If you have smoked or chewed Tobacco  in the last 2 yrs please stop smoking, stop any regular Alcohol  and or any Recreational drug use.  On your next visit with your primary care physician please Get Medicines reviewed and adjusted.  Please request your Prim.MD to go over all Hospital Tests and Procedure/Radiological results at the follow up, please get all Hospital records sent to your Prim MD by signing hospital release before you go home.  If you experience worsening of your admission symptoms, develop shortness of breath, life threatening emergency, suicidal or homicidal thoughts you must seek medical attention immediately by calling 911 or calling your MD immediately  if symptoms less severe.  You Must read complete instructions/literature along with all the possible adverse reactions/side effects for all the Medicines you take and that have been prescribed to you. Take any new Medicines after you have completely understood and accpet all the possible adverse reactions/side effects.   Increase activity slowly    Complete by:  As directed       Discharge Medications   Allergies as of 01/02/2018   No Known Allergies     Medication List    STOP taking these medications   naproxen sodium 220 MG tablet Commonly known as:  ALEVE     TAKE these medications   apixaban 5 MG Tabs tablet Commonly known as:  ELIQUIS Take 2 tablets (10 mg total) by mouth 2 (two) times daily for 7 days.   apixaban 5 MG Tabs tablet Commonly known as:  ELIQUIS Take 1 tablet (5 mg total) by mouth 2 (two) times daily. Start taking on:  01/09/2018   levofloxacin 750 MG tablet Commonly known as:  LEVAQUIN Take 1 tablet (750 mg total) by mouth daily.   ranitidine 75 MG tablet Commonly known as:  ZANTAC Take 75 mg by mouth daily as needed for heartburn.       Follow-up Information    [name], FNP. Schedule an appointment as soon as possible for a visit in 1 week(s).   Specialty:  Family Medicine Contact information: [address] [place] [address] [phone]           Major procedures and Radiology Reports - PLEASE review detailed and final reports thoroughly  -      TTE -  - Left ventricle: The cavity size was normal. There was moderate concentric hypertrophy. Systolic function was normal. Wall motion was normal; there were no regional wall motion abnormalities. Left ventricular diastolic function parameters were normal. - Aortic valve: There was no regurgitation. - Mitral valve: There was mild regurgitation. - Left atrium: The atrium was normal in size. - Right ventricle: Systolic function was normal. - Tricuspid valve: There was no regurgitation. - Pulmonary arteries: Systolic pressure was within the normal range. - Inferior vena cava: The vessel was normal in size. - Pericardium, extracardiac: There was no pericardial effusion.  Venous US -   Right: There is no evidence of deep vein thrombosis in the lower extremity. Left: Findings consistent with acute deep vein thrombosis  involving the left peroneal vein.   Ct Chest Wo Contrast  Result Date: 12/30/2017 CLINICAL DATA:  Persistent pneumonias since July 2019. EXAM: CT CHEST WITHOUT CONTRAST TECHNIQUE: Multidetector CT imaging of the chest was performed following the standard protocol without IV contrast. COMPARISON:  Chest radiographs dated 12/12/2017, 11/14/2017 and 09/19/2017. FINDINGS: Cardiovascular: Coronary artery calcifications.  Normal sized heart. Mediastinum/Nodes:  No enlarged mediastinal or axillary lymph nodes. Thyroid gland, trachea, and esophagus demonstrate no significant findings. Lungs/Pleura: Oval area dense peripheral consolidation and possible liquefaction in the posterolateral right lower lobe, measuring 5.1 x 2.0 cm on image number 145 series 8. Similar area with some central gas or air in the posterolateral aspect of the left lower lobe measuring 4.0 x 2.4 cm on image number 148 series 8. Similar area posteromedially in the left lower lobe, measuring 4.4 x 0.9 cm on image number 152 series 8. Small similar area posteriorly at the left lung base in the left lower lobe on image number 158, measuring 1.2 x 0.6 cm. Area of probable subsegmental atelectasis in the medial aspect of the right middle lobe and similar area along the major fissure in the right lower lobe on image number 142 series 8. 4 mm right lower lobe nodule on image number 120 series 8. Upper Abdomen: Unremarkable. Musculoskeletal: Unremarkable bones. IMPRESSION: 1. Oval area of dense consolidation and possible liquefaction in the posterolateral right lower lobe and 3 similar areas in the left lower lobe. These are concerning for areas of pulmonary infarction. Multifocal infection is also a possibility. These do not have the cavitation typically seen with septic emboli. Areas of rounded atelectasis are unlikely since these are multiple. Recommend a chest CTA to exclude pulmonary emboli. 2. 4 mm right lower lobe nodule. No follow-up needed if patient  is low-risk. Non-contrast chest CT can be considered in 12 months if patient is high-risk. This recommendation follows the consensus statement: Guidelines for Management of Incidental Pulmonary Nodules Detected on CT Images: From the Fleischner Society 2017; Radiology 2017; 284:228-243. 3. Atheromatous coronary artery calcifications. These results will be called to the ordering clinician or representative by the Radiologist Assistant, and communication documented in the PACS or zVision Dashboard. Electronically Signed   By: Beckie Salts M.D.   On: 12/30/2017 17:32   Ct Angio Chest Pe W Or Wo Contrast  Result Date: 12/31/2017 CLINICAL DATA:  Abnormal CT yesterday. Follow-up exam. Questionable septic emboli. EXAM: CT ANGIOGRAPHY CHEST WITH CONTRAST TECHNIQUE: Multidetector CT imaging of the chest was performed using the standard protocol during bolus administration of intravenous contrast. Multiplanar CT image reconstructions and MIPs were obtained to evaluate the vascular anatomy. CONTRAST:  75mL ISOVUE-370 IOPAMIDOL (ISOVUE-370) INJECTION 76% COMPARISON:  CT of the chest on 12/30/2017 showing peripheral areas consolidation at both lung bases FINDINGS: Cardiovascular: There is satisfactory opacification of the pulmonary arteries to the segmental level. In a segmental branch to the right lower lobe, superior segment, there is occlusive thrombus, but no associated consolidation in this portion of the lung. In a small subsegmental branch to the left lower lobe, just above the pleural based focus of left lower lobe consolidation, there is opacification of a peripheral pulmonary artery which could reflect a pulmonary embolism. There is no other evidence of a pulmonary embolus. Great vessels are normal in caliber. No aortic dissection or atherosclerosis. Minor left coronary artery calcifications. Heart is normal size.  No pericardial effusion. Mediastinum/Nodes: No enlarged mediastinal, hilar, or axillary lymph nodes.  Thyroid gland, trachea, and esophagus demonstrate no significant findings. Lungs/Pleura: Peripheral areas of lower lobe lung consolidation are unchanged from the previous day's CT. Consolidation or scarring in the anterior inferior right middle lobe is stable. 4 mm right lower lobe nodule, image 104, series 11, also unchanged. No new areas of lung opacification. No evidence of pulmonary edema. No pleural effusion or pneumothorax. Upper Abdomen: No acute abnormality. Musculoskeletal:  No chest wall abnormality. No acute or significant osseous findings. Review of the MIP images confirms the above findings. IMPRESSION: 1. Convincing segmental pulmonary embolus to the superior segment of the right lower lobe, without associated lung consolidation. There is an equivocal small subsegmental branch pulmonary embolus adjacent to 1 of the areas of peripheral lung consolidation in the left lower lobe. There are no other pulmonary emboli. Given these limited embolic findings, the areas of lung consolidation described on the previous day's CT, are unlikely to reflect infarction. These are most likely multifocal areas of infection. 2. No new lung abnormalities. Electronically Signed   By: Amie Portland M.D.   On: 12/31/2017 15:24    Micro Results     Recent Results (from the past 240 hour(s))  MRSA PCR Screening     Status: None   Collection Time: 12/31/17  8:40 PM  Result Value Ref Range Status   MRSA by PCR NEGATIVE NEGATIVE Final    Comment:        The GeneXpert MRSA Assay (FDA approved for NASAL specimens only), is one component of a comprehensive MRSA colonization surveillance program. It is not intended to diagnose MRSA infection nor to guide or monitor treatment for MRSA infections. Performed at [facility], [address], [place], [place] [address]     Today   Subjective    Rohit Deloria today has no headache,no chest abdominal pain,no new weakness tingling or numbness, feels much better  wants to go home today.     Objective   Blood pressure 105/70, pulse (!) 51, temperature 97.9 F (36.6 C), temperature source Oral, resp. rate 18, height 6\' 4"  (1.93 m), weight 82.1 kg, SpO2 100 %.  No intake or output data in the 24 hours ending 01/02/18 0943  Exam Awake Alert, Oriented x 3, No new F.N deficits, Normal affect [place].AT,PERRAL Supple Neck,No JVD, No cervical lymphadenopathy appriciated.  Symmetrical Chest wall movement, Good air movement bilaterally, CTAB RRR,No Gallops,Rubs or new Murmurs, No Parasternal Heave +ve B.Sounds, Abd Soft, Non tender, No organomegaly appriciated, No rebound -guarding or rigidity. No Cyanosis, Clubbing or edema, No new Rash or bruise   Data Review   CBC w Diff:  Lab Results  Component Value Date   WBC 4.1 01/02/2018   HGB 9.8 (L) 01/02/2018   HGB 11.8 (L) 08/04/2017   HCT 32.0 (L) 01/02/2018   PLT 256 01/02/2018   PLT 195 08/04/2017   LYMPHOPCT 55 08/04/2017   MONOPCT 16 08/04/2017   EOSPCT 7 08/04/2017   BASOPCT 0 08/04/2017    CMP:  Lab Results  Component Value Date   NA 139 01/01/2018   K 4.1 01/01/2018   CL 104 01/01/2018   CO2 27 01/01/2018   BUN 13 01/01/2018   CREATININE 0.79 01/01/2018   CREATININE 0.79 08/04/2017   PROT 7.4 01/01/2018   ALBUMIN 3.4 (L) 01/01/2018   BILITOT 0.5 01/01/2018   BILITOT 0.4 08/04/2017   ALKPHOS 40 01/01/2018   AST 27 01/01/2018   AST 57 (H) 08/04/2017   ALT 17 01/01/2018   ALT 25 08/04/2017  .   Total Time in preparing paper work, data evaluation and todays exam - 35 minutes  Susa Raring M.D on 01/02/2018 at 9:43 AM  [facility]   Office  [phone]

## 2018-01-03 LAB — HOMOCYSTEINE: HOMOCYSTEINE-NORM: 11.2 umol/L (ref 0.0–15.0)

## 2018-01-03 LAB — CARDIOLIPIN ANTIBODIES, IGG, IGM, IGA
Anticardiolipin IgA: <9 APL U/mL (ref 0–11)
Anticardiolipin IgM: <9 MPL U/mL (ref 0–12)

## 2018-01-03 LAB — BETA-2-GLYCOPROTEIN I ABS, IGG/M/A: Beta-2-Glycoprotein I IgM: <9 GPI IgM units (ref 0–32)

## 2018-01-05 LAB — PROTEIN S ACTIVITY: Protein S Activity: 111 % (ref 63–140)

## 2018-01-05 LAB — PROTEIN S, TOTAL: PROTEIN S AG TOTAL: 128 % (ref 60–150)

## 2018-01-05 LAB — PROTEIN C ACTIVITY: Protein C Activity: 116 % (ref 73–180)

## 2018-01-06 DIAGNOSIS — I82402 Acute embolism and thrombosis of unspecified deep veins of left lower extremity: Secondary | ICD-10-CM | POA: Diagnosis not present

## 2018-01-06 DIAGNOSIS — I2699 Other pulmonary embolism without acute cor pulmonale: Secondary | ICD-10-CM | POA: Diagnosis not present

## 2018-01-06 LAB — LUPUS ANTICOAGULANT PANEL
DRVVT: 53.4 s — AB (ref 0.0–47.0)
PTT LA: 41.9 s (ref 0.0–51.9)

## 2018-01-06 LAB — DRVVT MIX: DRVVT MIX: 46.8 s (ref 0.0–47.0)

## 2018-01-06 LAB — PROTHROMBIN GENE MUTATION

## 2018-01-21 LAB — FACTOR 5 LEIDEN

## 2018-02-08 DIAGNOSIS — I2699 Other pulmonary embolism without acute cor pulmonale: Secondary | ICD-10-CM | POA: Diagnosis not present

## 2018-02-14 DIAGNOSIS — Z86711 Personal history of pulmonary embolism: Secondary | ICD-10-CM | POA: Diagnosis not present

## 2018-05-11 DIAGNOSIS — Z8042 Family history of malignant neoplasm of prostate: Secondary | ICD-10-CM | POA: Diagnosis not present

## 2018-05-11 DIAGNOSIS — Z23 Encounter for immunization: Secondary | ICD-10-CM | POA: Diagnosis not present

## 2018-05-11 DIAGNOSIS — D709 Neutropenia, unspecified: Secondary | ICD-10-CM | POA: Diagnosis not present

## 2018-05-11 DIAGNOSIS — R079 Chest pain, unspecified: Secondary | ICD-10-CM | POA: Diagnosis not present

## 2018-05-11 DIAGNOSIS — Z13220 Encounter for screening for lipoid disorders: Secondary | ICD-10-CM | POA: Diagnosis not present

## 2018-05-11 DIAGNOSIS — Z0000 Encounter for general adult medical examination without abnormal findings: Secondary | ICD-10-CM | POA: Diagnosis not present

## 2018-05-13 DIAGNOSIS — I2699 Other pulmonary embolism without acute cor pulmonale: Secondary | ICD-10-CM | POA: Diagnosis not present

## 2018-07-04 DIAGNOSIS — R0602 Shortness of breath: Secondary | ICD-10-CM | POA: Diagnosis not present

## 2018-07-04 DIAGNOSIS — Z86711 Personal history of pulmonary embolism: Secondary | ICD-10-CM | POA: Diagnosis not present

## 2018-07-04 DIAGNOSIS — K219 Gastro-esophageal reflux disease without esophagitis: Secondary | ICD-10-CM | POA: Diagnosis not present

## 2018-07-18 ENCOUNTER — Other Ambulatory Visit: Payer: Self-pay | Admitting: Family Medicine

## 2018-07-18 DIAGNOSIS — R0602 Shortness of breath: Secondary | ICD-10-CM

## 2018-07-18 DIAGNOSIS — Z86711 Personal history of pulmonary embolism: Secondary | ICD-10-CM

## 2018-07-18 DIAGNOSIS — R079 Chest pain, unspecified: Secondary | ICD-10-CM

## 2018-07-19 ENCOUNTER — Other Ambulatory Visit: Payer: Self-pay

## 2018-07-19 ENCOUNTER — Ambulatory Visit
Admission: RE | Admit: 2018-07-19 | Discharge: 2018-07-19 | Disposition: A | Discharge status: Home / Self Care | Payer: BLUE CROSS/BLUE SHIELD | Source: Ambulatory Visit | Attending: Family Medicine | Admitting: Family Medicine

## 2018-07-19 DIAGNOSIS — R05 Cough: Secondary | ICD-10-CM | POA: Diagnosis not present

## 2018-07-19 DIAGNOSIS — R079 Chest pain, unspecified: Secondary | ICD-10-CM

## 2018-07-19 DIAGNOSIS — Z86711 Personal history of pulmonary embolism: Secondary | ICD-10-CM

## 2018-07-19 DIAGNOSIS — R0602 Shortness of breath: Secondary | ICD-10-CM

## 2018-07-19 MED ORDER — IOPAMIDOL (ISOVUE-370) INJECTION 76%
75.0000 mL | Freq: Once | INTRAVENOUS | Status: AC | PRN
  Administered 2018-07-19: 75 mL via INTRAVENOUS

## 2018-10-04 DIAGNOSIS — Z86711 Personal history of pulmonary embolism: Secondary | ICD-10-CM | POA: Diagnosis not present

## 2018-10-04 DIAGNOSIS — I2510 Atherosclerotic heart disease of native coronary artery without angina pectoris: Secondary | ICD-10-CM | POA: Diagnosis not present

## 2018-10-04 DIAGNOSIS — R0789 Other chest pain: Secondary | ICD-10-CM | POA: Diagnosis not present

## 2018-10-04 DIAGNOSIS — R911 Solitary pulmonary nodule: Secondary | ICD-10-CM | POA: Diagnosis not present

## 2018-10-05 ENCOUNTER — Telehealth: Payer: Self-pay

## 2018-10-31 ENCOUNTER — Ambulatory Visit: Payer: BC Managed Care – PPO | Admitting: Cardiology

## 2018-10-31 ENCOUNTER — Encounter: Payer: Self-pay | Admitting: Cardiology

## 2018-10-31 ENCOUNTER — Other Ambulatory Visit: Payer: Self-pay

## 2018-10-31 VITALS — BP 111/77 | HR 60 | Ht 76.0 in | Wt 170.0 lb

## 2018-10-31 DIAGNOSIS — R079 Chest pain, unspecified: Secondary | ICD-10-CM | POA: Insufficient documentation

## 2018-10-31 DIAGNOSIS — R0789 Other chest pain: Secondary | ICD-10-CM

## 2018-10-31 NOTE — Progress Notes (Signed)
Patient referred by Soundra PilonBrake, Andrew R, FNP for chest pain  Subjective:   David Stuart, male    DOB: May 18, 1965, 53 y.o.   MRN: [id number]   Chief Complaint  Patient presents with  . Chest Pain    chest pain on and off since october  . New Patient (Initial Visit)     HPI  53 y.o. African American male with PMH sig for provoked DVT/PE 12/2017 started on eliquis, Neutropenia.  He is here for evaluation of ongoing chest pain which seemed to begin after his diagnosis of DVT/PE.  Initially a that time it was right sided pain where the PE was located.  However, more recently he has been having brief episodes of 1-2 second chest pain in his left upper chest. He was evaluated for this pain in the ED in March where he had a repeat CT angiogram which was negative for PE.  He is still very active, runs 4 miles in the morning and bikes around 20 miles.     Past Medical History:  Diagnosis Date  . Neutropenia [facility](HCC)      Past Surgical History:  Procedure Laterality Date  . COLONOSCOPY       Social History   Socioeconomic History  . Marital status: Married    Spouse name: Not on file  . Number of children: Not on file  . Years of education: Not on file  . Highest education level: Not on file  Occupational History  . Not on file  Social Needs  . Financial resource strain: Not on file  . Food insecurity    Worry: Not on file    Inability: Not on file  . Transportation needs    Medical: Not on file    Non-medical: Not on file  Tobacco Use  . Smoking status: Never Smoker  . Smokeless tobacco: Never Used  Substance and Sexual Activity  . Alcohol use: No  . Drug use: Not on file  . Sexual activity: Not on file  Lifestyle  . Physical activity    Days per week: Not on file    Minutes per session: Not on file  . Stress: Not on file  Relationships  . Social Musicianconnections    Talks on phone: Not on file    Gets together: Not on file    Attends religious service: Not on file   Active member of club or organization: Not on file    Attends meetings of clubs or organizations: Not on file    Relationship status: Not on file  . Intimate partner violence    Fear of current or ex partner: Not on file    Emotionally abused: Not on file    Physically abused: Not on file    Forced sexual activity: Not on file  Other Topics Concern  . Not on file  Social History Narrative  . Not on file     Family History  Problem Relation Age of Onset  . Parkinson's disease Mother   . Lupus Mother   . Prostate cancer Father        died at age 265 from prostate cancer  . Colon cancer Father   . Other Sister      Current Outpatient Medications on File Prior to Visit  Medication Sig Dispense Refill  . apixaban (ELIQUIS) 5 MG TABS tablet Take 1 tablet (5 mg total) by mouth 2 (two) times daily. 60 tablet 0  . omeprazole (PRILOSEC) 40 MG capsule Take  40 mg by mouth daily.     No current facility-administered medications on file prior to visit.     Cardiovascular studies:  12/2017  ECHO Study Conclusions  - Left ventricle: The cavity size was normal. There was moderate   concentric hypertrophy. Systolic function was normal. Wall motion   was normal; there were no regional wall motion abnormalities.   Left ventricular diastolic function parameters were normal. - Aortic valve: There was no regurgitation. - Mitral valve: There was mild regurgitation. - Left atrium: The atrium was normal in size. - Right ventricle: Systolic function was normal. - Tricuspid valve: There was no regurgitation. - Pulmonary arteries: Systolic pressure was within the normal   range. - Inferior vena cava: The vessel was normal in size. - Pericardium, extracardiac: There was no pericardial effusion.  ECG  Normal sinus rhythm with sinus arrhythmia Right atrial enlargement Moderate voltage criteria for LVH     Recent labs: CBC    Component Value Date/Time   WBC 4.1 01/02/2018 0105   RBC  4.27 01/02/2018 0105   HGB 9.8 (L) 01/02/2018 0105   HGB 11.8 (L) 08/04/2017 1505   HCT 32.0 (L) 01/02/2018 0105   PLT 256 01/02/2018 0105   PLT 195 08/04/2017 1505   MCV 74.9 (L) 01/02/2018 0105   MCH 23.0 (L) 01/02/2018 0105   MCHC 30.6 01/02/2018 0105   RDW 15.5 01/02/2018 0105   LYMPHSABS 1.6 08/04/2017 1505   MONOABS 0.5 08/04/2017 1505   EOSABS 0.2 08/04/2017 1505   BASOSABS 0.0 08/04/2017 1505   BMP Latest Ref Rng & Units 01/01/2018 12/31/2017 09/19/2017  Glucose 70 - 99 mg/dL 89 85 92  BUN 6 - 20 mg/dL 13 15 16   Creatinine 0.61 - 1.24 mg/dL 0.79 0.81 0.75  Sodium 135 - 145 mmol/L 139 138 137  Potassium 3.5 - 5.1 mmol/L 4.1 4.4 4.4  Chloride 98 - 111 mmol/L 104 102 102  CO2 22 - 32 mmol/L 27 25 28   Calcium 8.9 - 10.3 mg/dL 8.9 9.5 9.4     Review of Systems  Constitution: Negative for decreased appetite, malaise/fatigue, weight gain and weight loss.  HENT: Negative for congestion.   Eyes: Negative for visual disturbance.  Cardiovascular: Positive for chest pain. Negative for dyspnea on exertion, leg swelling, palpitations and syncope.  Respiratory: Negative for cough.   Endocrine: Negative for cold intolerance.  Hematologic/Lymphatic: Does not bruise/bleed easily.  Skin: Negative for itching and rash.  Musculoskeletal: Negative for myalgias.  Gastrointestinal: Negative for abdominal pain, nausea and vomiting.  Genitourinary: Negative for dysuria.  Neurological: Negative for dizziness and weakness.  Psychiatric/Behavioral: The patient is not nervous/anxious.   All other systems reviewed and are negative.        Vitals:   10/31/18 1458  BP: 111/77  Pulse: 60  SpO2: 98%     Body mass index is 20.69 kg/m. Filed Weights   10/31/18 1458  Weight: 170 lb (77.1 kg)     Objective:   Physical Exam  Constitutional: He is oriented to person, place, and time. He appears well-developed and well-nourished. No distress.  HENT:  Head: Normocephalic and atraumatic.   Eyes: Pupils are equal, round, and reactive to light. Conjunctivae are normal.  Neck: No JVD present.  Cardiovascular: Normal rate, regular rhythm and intact distal pulses.  Pulmonary/Chest: Effort normal and breath sounds normal. He has no wheezes. He has no rales.  Abdominal: Soft. Bowel sounds are normal. There is no rebound.  Musculoskeletal:  General: No edema.  Lymphadenopathy:    He has no cervical adenopathy.  Neurological: He is alert and oriented to person, place, and time. No cranial nerve deficit.  Skin: Skin is warm and dry.  Psychiatric: He has a normal mood and affect.  Nursing note and vitals reviewed.         Assessment & Recommendations:   53 y.o. African American male with h/o provoked DVT/PE 12/2017, now with recurrent left sided chest pain episodes.  Chest pain: Low suspicion for angina. However, given coronary calcification seen on CT chest, recommend coronary CTA +/-FFR for definitive evaluation. Will also check lipid panel.   Thank you for referring the patient to us. Please feel free to contact with any questions.  Elder NegusManish J Kylie Simmonds, MD [facility] Pager: [phone] Office: [phone] If no answer Cell [phone]

## 2018-11-01 DIAGNOSIS — I2510 Atherosclerotic heart disease of native coronary artery without angina pectoris: Secondary | ICD-10-CM | POA: Diagnosis not present

## 2018-11-01 DIAGNOSIS — R0789 Other chest pain: Secondary | ICD-10-CM | POA: Diagnosis not present

## 2018-11-02 LAB — LIPID PANEL
Chol/HDL Ratio: 2.5 ratio (ref 0.0–5.0)
Cholesterol, Total: 134 mg/dL (ref 100–199)
HDL: 54 mg/dL (ref >39)
LDL Calculated: 69 mg/dL (ref 0–99)
Triglycerides: 53 mg/dL (ref 0–149)
VLDL Cholesterol Cal: 11 mg/dL (ref 5–40)

## 2018-11-04 ENCOUNTER — Encounter: Payer: Self-pay | Admitting: Internal Medicine

## 2018-11-04 ENCOUNTER — Ambulatory Visit: Payer: BLUE CROSS/BLUE SHIELD | Admitting: Internal Medicine

## 2018-11-04 ENCOUNTER — Other Ambulatory Visit: Payer: Self-pay

## 2018-11-04 DIAGNOSIS — I2699 Other pulmonary embolism without acute cor pulmonale: Secondary | ICD-10-CM | POA: Diagnosis not present

## 2018-11-04 DIAGNOSIS — R0789 Other chest pain: Secondary | ICD-10-CM

## 2018-11-04 DIAGNOSIS — R079 Chest pain, unspecified: Secondary | ICD-10-CM

## 2018-11-04 LAB — SEDIMENTATION RATE: Sed Rate: 23 mm/hr — ABNORMAL HIGH (ref 0–20)

## 2018-11-04 NOTE — Progress Notes (Signed)
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 never resp problems until returned from car trip to [place] w/in 2 weeks R cp waxed and waned and then similar Pain on L  Initially felt to be CAP > then Cta chest  12/31/17 dx bilateral small peripheral  PE  rx  p rx heparin 90% better by d/c p rx with hep/ maintained on DOAC since  with but persistent sensation of discomfort upper L chest so underwent repeat  CTa  3/244/20 neg and still cp present daily so referred to pulmonary clinic 11/04/2018 by  PCP =  Jillyn Ledger NP       History of Present Illness  11/04/2018  Pulmonary/ 1st office eval/Nyasia Baxley  Chief Complaint  Patient presents with  . Pulmonary Consult    Referred by Jillyn Ledger. Pt states had PNA and PE in 2019. He has been having chest discomfort-"feels like gas pain"- pretty constant and sometimes worse when he is lying down.   Dyspnea:  Was riding 30 miles a day at baseline and back riding an hour including hills not sure ? Conditions  Cough: no  Sleep: bed is horizontal rarely in that position, never wakes him up SABA use: none Shortest time 20 min - up to several hours   No obvious patterns in  day to day or daytime variability or assoc excess/ purulent sputum or mucus plugs or hemoptysis or   subjective wheeze or overt sinus or hb symptoms.   Sleeps fine  without nocturnal  or early am exacerbation  of respiratory  c/o's or need for noct saba. Also denies any obvious fluctuation of symptoms with weather or environmental changes or other aggravating or alleviating factors except as outlined above   No unusual exposure hx or h/o childhood pna/ asthma or knowledge of premature birth.  Current Allergies, Complete Past Medical History, Past Surgical History, Family History, and Social History were reviewed in [employer] record.  ROS  The following are not active  complaints unless bolded Hoarseness, sore throat, dysphagia, dental problems, itching, sneezing,  nasal congestion or discharge of excess mucus or purulent secretions, ear ache,   fever, chills, sweats, unintended wt loss or wt gain, classically pleuritic or exertional cp,  orthopnea pnd or arm/hand swelling  or leg swelling, presyncope, palpitations, abdominal pain, anorexia, nausea, vomiting, diarrhea  or change in bowel habits or change in bladder habits, change in stools or change in urine, dysuria, hematuria,  rash, arthralgias, visual complaints, headache, numbness, weakness or ataxia or problems with walking or coordination,  change in mood or  memory.           Past Medical History:  Diagnosis Date  . Neutropenia [facility])     Outpatient Medications Prior to Visit  Medication Sig Dispense Refill  . apixaban (ELIQUIS) 5 MG TABS tablet Take 1 tablet (5 mg total) by mouth 2 (two) times daily. 60 tablet 0  . omeprazole (PRILOSEC) 40 MG capsule Take 40 mg by mouth daily after bfast        Objective:     BP 90/60 (BP Location: Left Arm, Cuff Size: Normal)   Pulse 60   Temp 97.7 F (36.5 C) (Oral)   Ht 6' 4"  (1.93 m)   Wt 171 lb 6.4 oz (77.7 kg)   SpO2 99% Comment: on RA  BMI 20.86 kg/m   SpO2: 99 %(  on RA)   HEENT: nl dentition, turbinates bilaterally, and oropharynx. Nl external ear canals without cough reflex   NECK :  without JVD/Nodes/TM/ nl carotid upstrokes bilaterally   LUNGS: no acc muscle use,  Nl contour chest which is clear to A and P bilaterally without cough on insp or exp maneuvers   CV:  RRR  no s3 or murmur or increase in P2, and no edema   ABD:  soft and nontender with nl inspiratory excursion in the supine position. No bruits or organomegaly appreciated, bowel sounds nl  MS:  Nl gait/ ext warm without deformities, calf tenderness, cyanosis or clubbing No obvious joint restrictions   SKIN: warm and dry without lesions    NEURO:  alert, approp, nl  sensorium with  no motor or cerebellar deficits apparent.     Labs ordered 11/04/2018  Ana, esr       Assessment   No problem-specific Assessment & Plan notes found for this encounter.     Christinia Gully, MD 11/04/2018

## 2018-11-04 NOTE — Patient Instructions (Addendum)
Try taking prilosec 40 mg Take 30-60 min before first meal of the day for a month then stop   GERD (REFLUX)  is an extremely common cause of respiratory symptoms just like yours , many times with no obvious heartburn at all.    It can be treated with medication, but also with lifestyle changes including elevation of the head of your bed (ideally with 6 -8inch blocks under the headboard of your bed),  Smoking cessation, avoidance of late meals, excessive alcohol, and avoid fatty foods, chocolate, peppermint, colas, red wine, and acidic juices such as orange juice.  NO MINT OR MENTHOL PRODUCTS SO NO COUGH DROPS  USE SUGARLESS CANDY INSTEAD (Jolley ranchers or Stover's or Life Savers) or even ice chips will also do - the key is to swallow to prevent all throat clearing. NO OIL BASED VITAMINS - use powdered substitutes.  Avoid fish oil when coughing.    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 Frequently these patients have had multiple negative GI workups and CT scans.  Treatment consists of avoiding foods that cause gas (especially boiled eggs, mexcican food but especially  beans and undercooked vegetables like spinach and some salads)  and citrucel 1 heaping tsp twice daily with a large glass of water.  Pain should improve w/in 2 weeks and if not then consider further GI work up.        Please remember to go to the lab department   for your tests - we will call you with the results when they are available.

## 2018-11-05 ENCOUNTER — Encounter: Payer: Self-pay | Admitting: Internal Medicine

## 2018-11-05 NOTE — Assessment & Plan Note (Signed)
Onset of symptoms w/in 2 weeks of long car ride 11/2017  - Cta chest  12/31/17 dx bilateral small peripheral  PE with L DVT on dopplers 01/01/18  - Hypercoag profile neg x for minimally elevated lupus anticogulant  - Echo 01/01/18 nl Right pressures   Will do ANA as his mother had lupus and he does have a slt abn lupus anticogulant but duration of rx should depend on whether his L DVT has resolved first and foremost then maybe consider hematology consult if neg venous dopplers prior to stopping doac.

## 2018-11-05 NOTE — Assessment & Plan Note (Signed)
Onset p small peripheral PE's 12/2017 almost always occur sitting, never wakes him up or notes with ex - rec 11/04/2018 rx as IBS  I suspect his has a hypersensitive diaphragm and now has a splenic flex syndrome contibuting to pain o/w would be difficult to explain his pain onset having anything to do with his PE but this is not pleurisy, there are not CTa findings to suggest a pulmonary problem including the absence of effusion, and his ex tol is excellent so so further pulmonary eval indicated.   Plans to have his coronary calcification quantified with CT heart with f/u as indicated by cards and I encouraged his to complete that even if pain resolves on IBS rx    Total time devoted to counseling  > 50 % of initial 60 min office visit:  review case with pt/ discussion of options/alternatives/ personally creating written customized instructions  in presence of pt  then going over those specific  Instructions directly with the pt including how to use all of the meds but in particular covering each new medication in detail and the difference between the maintenance= "automatic" meds and the prns using an action plan format for the latter (If this problem/symptom => do that organization reading Left to right).  Please see AVS from this visit for a full list of these instructions which I personally wrote for this pt and  are unique to this visit.

## 2018-11-07 LAB — ANA: Anti Nuclear Antibody (ANA): NEGATIVE

## 2018-11-15 ENCOUNTER — Telehealth (HOSPITAL_COMMUNITY): Payer: Self-pay | Admitting: Emergency Medicine

## 2018-11-15 NOTE — Telephone Encounter (Signed)
Left message on voicemail with name and callback number Rodgers Likes RN Navigator Cardiac Imaging  [facility] [phone] Office [phone] Cell  

## 2018-11-15 NOTE — Telephone Encounter (Signed)
Pt returning phone call regarding upcoming cardiac imaging study; pt verbalizes understanding of appt date/time, parking situation and where to check in, pre-test NPO status and medications ordered, and verified current allergies; name and call back number provided for further questions should they arise Alvy Alsop RN Navigator Cardiac Imaging [place] [facility] [phone] office [phone] cell  Pt denies covid symptoms, verbalized understanding of visitor policy. 

## 2018-11-16 ENCOUNTER — Ambulatory Visit (HOSPITAL_COMMUNITY): Admission: RE | Admit: 2018-11-16 | Payer: BC Managed Care – PPO | Source: Ambulatory Visit

## 2018-11-16 ENCOUNTER — Ambulatory Visit (HOSPITAL_COMMUNITY)
Admission: RE | Admit: 2018-11-16 | Discharge: 2018-11-16 | Disposition: A | Discharge status: Home / Self Care | Payer: BC Managed Care – PPO | Source: Ambulatory Visit | Attending: Cardiology | Admitting: Cardiology

## 2018-11-16 ENCOUNTER — Encounter (HOSPITAL_COMMUNITY): Payer: Self-pay

## 2018-11-16 DIAGNOSIS — R0789 Other chest pain: Secondary | ICD-10-CM | POA: Diagnosis not present

## 2018-11-16 DIAGNOSIS — R079 Chest pain, unspecified: Secondary | ICD-10-CM

## 2018-11-16 MED ORDER — NITROGLYCERIN 0.4 MG SL SUBL
SUBLINGUAL_TABLET | SUBLINGUAL | Status: AC
  Filled 2018-11-16: qty 1

## 2018-11-16 MED ORDER — IOHEXOL 350 MG/ML SOLN
80.0000 mL | Freq: Once | INTRAVENOUS | Status: AC | PRN
  Administered 2018-11-16: 80 mL via INTRAVENOUS

## 2018-11-16 MED ORDER — NITROGLYCERIN 0.4 MG SL SUBL
0.8000 mg | SUBLINGUAL_TABLET | Freq: Once | SUBLINGUAL | Status: AC
  Administered 2018-11-16: 08:00:00 0.8 mg via SUBLINGUAL

## 2018-11-18 NOTE — Progress Notes (Signed)
No severe blockages in heart arteries that would explain chest pain, but there is mild plaque build up. I will discuss more during the upcoming office visit.  Thanks MJP

## 2018-11-21 NOTE — Progress Notes (Signed)
Pt aware.

## 2018-12-02 DIAGNOSIS — Z20828 Contact with and (suspected) exposure to other viral communicable diseases: Secondary | ICD-10-CM | POA: Diagnosis not present

## 2018-12-02 DIAGNOSIS — Z03818 Encounter for observation for suspected exposure to other biological agents ruled out: Secondary | ICD-10-CM | POA: Diagnosis not present

## 2018-12-02 DIAGNOSIS — Z7189 Other specified counseling: Secondary | ICD-10-CM | POA: Diagnosis not present

## 2018-12-14 ENCOUNTER — Ambulatory Visit: Payer: BC Managed Care – PPO | Admitting: Cardiology

## 2018-12-21 ENCOUNTER — Telehealth: Payer: BC Managed Care – PPO | Admitting: Cardiology

## 2018-12-21 ENCOUNTER — Encounter: Payer: Self-pay | Admitting: Cardiology

## 2018-12-21 VITALS — HR 54 | Ht 76.0 in | Wt 175.0 lb

## 2018-12-21 DIAGNOSIS — R931 Abnormal findings on diagnostic imaging of heart and coronary circulation: Secondary | ICD-10-CM

## 2018-12-21 DIAGNOSIS — R079 Chest pain, unspecified: Secondary | ICD-10-CM

## 2018-12-21 DIAGNOSIS — R0789 Other chest pain: Secondary | ICD-10-CM | POA: Diagnosis not present

## 2018-12-21 DIAGNOSIS — I2510 Atherosclerotic heart disease of native coronary artery without angina pectoris: Secondary | ICD-10-CM | POA: Insufficient documentation

## 2018-12-21 MED ORDER — ROSUVASTATIN CALCIUM 5 MG PO TABS: 5.0000 mg | ORAL_TABLET | Freq: Every day | ORAL | 3 refills | Status: DC

## 2018-12-21 NOTE — Progress Notes (Addendum)
Patient referred by Soundra PilonBrake, Andrew R, FNP for chest pain  Subjective:   David Stuart, male    DOB: 10-10-1965, 53 y.o.   MRN: [id number]  I connected with the patient on 12/21/2018 by a video enabled telemedicine application and verified that I am speaking with the correct person using two identifiers.     I discussed the limitations of evaluation and management by telemedicine and the availability of in person appointments. The patient expressed understanding and agreed to proceed.   This visit type was conducted due to national recommendations for restrictions regarding the COVID-19 Pandemic (e.g. social distancing).  This format is felt to be most appropriate for this patient at this time.  All issues noted in this document were discussed and addressed.  No physical exam was performed (except for noted visual exam findings with Tele health visits).  The patient has consented to conduct a Tele health visit and understands insurance will be billed.   Chief Complaint  Patient presents with  . Chest Pain  . Results    CTA and lab results    HPI   53 y.o. African American male with h/o provoked DVT/PE 12/2017, mild nonobstructive coronary artery disease.  Patient underwent coronary CTA for chest pain. CTA showed mild mid RCA and mid LAD CAD with calcium score of 269.   His chest pain has improved with initiation of omperazole.   Past Medical History:  Diagnosis Date  . Neutropenia [facility](HCC)      Past Surgical History:  Procedure Laterality Date  . COLONOSCOPY       Social History   Socioeconomic History  . Marital status: Married    Spouse name: Not on file  . Number of children: Not on file  . Years of education: Not on file  . Highest education level: Not on file  Occupational History  . Not on file  Social Needs  . Financial resource strain: Not on file  . Food insecurity    Worry: Not on file    Inability: Not on file  . Transportation needs    Medical: Not on  file    Non-medical: Not on file  Tobacco Use  . Smoking status: Never Smoker  . Smokeless tobacco: Never Used  Substance and Sexual Activity  . Alcohol use: No  . Drug use: Not on file  . Sexual activity: Not on file  Lifestyle  . Physical activity    Days per week: Not on file    Minutes per session: Not on file  . Stress: Not on file  Relationships  . Social Musicianconnections    Talks on phone: Not on file    Gets together: Not on file    Attends religious service: Not on file    Active member of club or organization: Not on file    Attends meetings of clubs or organizations: Not on file    Relationship status: Not on file  . Intimate partner violence    Fear of current or ex partner: Not on file    Emotionally abused: Not on file    Physically abused: Not on file    Forced sexual activity: Not on file  Other Topics Concern  . Not on file  Social History Narrative  . Not on file     Family History  Problem Relation Age of Onset  . Parkinson's disease Mother   . Lupus Mother   . Prostate cancer Father  died at age 73 from prostate cancer  . Colon cancer Father   . Other Sister      Current Outpatient Medications on File Prior to Visit  Medication Sig Dispense Refill  . apixaban (ELIQUIS) 5 MG TABS tablet Take 1 tablet (5 mg total) by mouth 2 (two) times daily. 60 tablet 0  . omeprazole (PRILOSEC) 40 MG capsule Take 40 mg by mouth daily.     No current facility-administered medications on file prior to visit.     Cardiovascular studies:  CTA 11/16/2018: 1. Coronary calcium score of 269. This was 98th percentile for age and sex matched control. 2. Normal coronary origin with right dominance. 3. Mild atherosclerosis of the mid RCA 25-49% stenosis CAD-RADs 2. 4. Minimal Calcified plaque in the mid LAD. 5. Recommend aggressive risk factor modification.   Echocardiogram 12/2017: Mod LVH. Normal EF. Mild mitral regurgitation.    Recent labs: Results for  ORENTHAL, DEBSKI (MRN [id number]) as of 12/21/2018 09:10  Ref. Range 11/01/2018 08:17  Total CHOL/HDL Ratio Latest Ref Range: 0.0 - 5.0 ratio 2.5  Cholesterol, Total Latest Ref Range: 100 - 199 mg/dL 134  HDL Cholesterol Latest Ref Range: >39 mg/dL 54  LDL (calc) Latest Ref Range: 0 - 99 mg/dL 69  Triglycerides Latest Ref Range: 0 - 149 mg/dL 53  VLDL Cholesterol Cal Latest Ref Range: 5 - 40 mg/dL 11    CBC    Component Value Date/Time   WBC 4.1 01/02/2018 0105   RBC 4.27 01/02/2018 0105   HGB 9.8 (L) 01/02/2018 0105   HGB 11.8 (L) 08/04/2017 1505   HCT 32.0 (L) 01/02/2018 0105   PLT 256 01/02/2018 0105   PLT 195 08/04/2017 1505   MCV 74.9 (L) 01/02/2018 0105   MCH 23.0 (L) 01/02/2018 0105   MCHC 30.6 01/02/2018 0105   RDW 15.5 01/02/2018 0105   LYMPHSABS 1.6 08/04/2017 1505   MONOABS 0.5 08/04/2017 1505   EOSABS 0.2 08/04/2017 1505   BASOSABS 0.0 08/04/2017 1505   BMP Latest Ref Rng & Units 01/01/2018 12/31/2017 09/19/2017  Glucose 70 - 99 mg/dL 89 85 92  BUN 6 - 20 mg/dL 13 15 16   Creatinine 0.61 - 1.24 mg/dL 0.79 0.81 0.75  Sodium 135 - 145 mmol/L 139 138 137  Potassium 3.5 - 5.1 mmol/L 4.1 4.4 4.4  Chloride 98 - 111 mmol/L 104 102 102  CO2 22 - 32 mmol/L 27 25 28   Calcium 8.9 - 10.3 mg/dL 8.9 9.5 9.4     Review of Systems  Constitution: Negative for decreased appetite, malaise/fatigue, weight gain and weight loss.  HENT: Negative for congestion.   Eyes: Negative for visual disturbance.  Cardiovascular: Positive for chest pain. Negative for dyspnea on exertion, leg swelling, palpitations and syncope.  Respiratory: Negative for cough.   Endocrine: Negative for cold intolerance.  Hematologic/Lymphatic: Does not bruise/bleed easily.  Skin: Negative for itching and rash.  Musculoskeletal: Negative for myalgias.  Gastrointestinal: Negative for abdominal pain, nausea and vomiting.  Genitourinary: Negative for dysuria.  Neurological: Negative for dizziness and weakness.   Psychiatric/Behavioral: The patient is not nervous/anxious.   All other systems reviewed and are negative.        Vitals:   12/21/18 1328  Pulse: (!) 54     Body mass index is 21.3 kg/m. Filed Weights   12/21/18 1328  Weight: 175 lb (79.4 kg)     Objective:   Physical Exam  Constitutional: He is oriented to person, place, and time. He appears  well-developed and well-nourished. No distress.  Pulmonary/Chest: Effort normal.  Neurological: He is alert and oriented to person, place, and time.  Psychiatric: He has a normal mood and affect.  Nursing note and vitals reviewed.         Assessment & Recommendations:   53 y.o. African American male with h/o provoked DVT/PE 12/2017, mild nonobstructive coronary artery disease.  Nonobstructive CAD: Recommend risk factor modification. Since patient is on eliquis for treatment of DVT/PE, will omit Aspirin. Recommend rosuvastatin 5 mg daily. Continue regular exercise.  Chest pain: Noncardiac, likely related to GERD.  Repeat lipid panel and f/u in 1 year.  Elder Negus, MD [facility] Pager: [phone] Office: [phone] If no answer Cell [phone]

## 2019-01-10 DIAGNOSIS — Z86711 Personal history of pulmonary embolism: Secondary | ICD-10-CM | POA: Diagnosis not present

## 2019-01-10 DIAGNOSIS — H1032 Unspecified acute conjunctivitis, left eye: Secondary | ICD-10-CM | POA: Diagnosis not present

## 2019-02-21 DIAGNOSIS — Z03818 Encounter for observation for suspected exposure to other biological agents ruled out: Secondary | ICD-10-CM | POA: Diagnosis not present

## 2019-03-18 ENCOUNTER — Other Ambulatory Visit: Payer: Self-pay | Admitting: Cardiology

## 2019-03-18 DIAGNOSIS — R931 Abnormal findings on diagnostic imaging of heart and coronary circulation: Secondary | ICD-10-CM

## 2019-04-04 DIAGNOSIS — D709 Neutropenia, unspecified: Secondary | ICD-10-CM | POA: Diagnosis not present

## 2019-04-04 DIAGNOSIS — F411 Generalized anxiety disorder: Secondary | ICD-10-CM | POA: Diagnosis not present

## 2019-04-04 DIAGNOSIS — Z86711 Personal history of pulmonary embolism: Secondary | ICD-10-CM | POA: Diagnosis not present

## 2019-04-04 DIAGNOSIS — R079 Chest pain, unspecified: Secondary | ICD-10-CM | POA: Diagnosis not present

## 2019-04-05 DIAGNOSIS — Z86711 Personal history of pulmonary embolism: Secondary | ICD-10-CM | POA: Diagnosis not present

## 2019-07-05 ENCOUNTER — Other Ambulatory Visit: Payer: Self-pay | Admitting: Family Medicine

## 2019-07-05 DIAGNOSIS — R911 Solitary pulmonary nodule: Secondary | ICD-10-CM

## 2019-07-20 ENCOUNTER — Ambulatory Visit
Admission: RE | Admit: 2019-07-20 | Discharge: 2019-07-20 | Disposition: A | Discharge status: Home / Self Care | Payer: BC Managed Care – PPO | Source: Ambulatory Visit | Attending: Family Medicine | Admitting: Family Medicine

## 2019-07-20 DIAGNOSIS — R911 Solitary pulmonary nodule: Secondary | ICD-10-CM

## 2019-08-05 IMAGING — CT CT CHEST W/O CM
2 of 3 series · 11 of 36 positions shown, 13 images · non-contrast
Comparison: Chest radiographs dated 12/12/2017, 11/14/2017 and
09/19/2017..

CLINICAL DATA: Persistent pneumonias since October 2017.

EXAM:
CT CHEST WITHOUT CONTRAST
TECHNIQUE: Multidetector CT imaging of the chest was performed following the
standard protocol without IV contrast.

[Series 2: chest 2.00 br40 s3 ax · axial · 0.46mm/px · z∈[+1333,+1645]mm · 8 of 184 slices shown, 10 images]
[im 14/184  mediastinal]
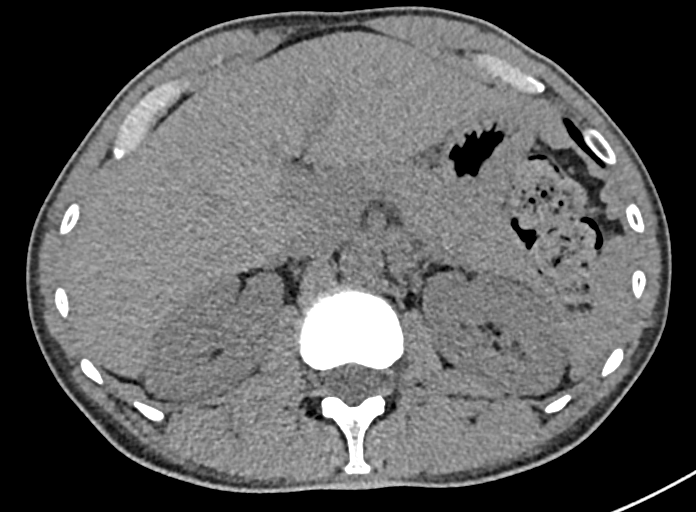
[im 14/184  lung]
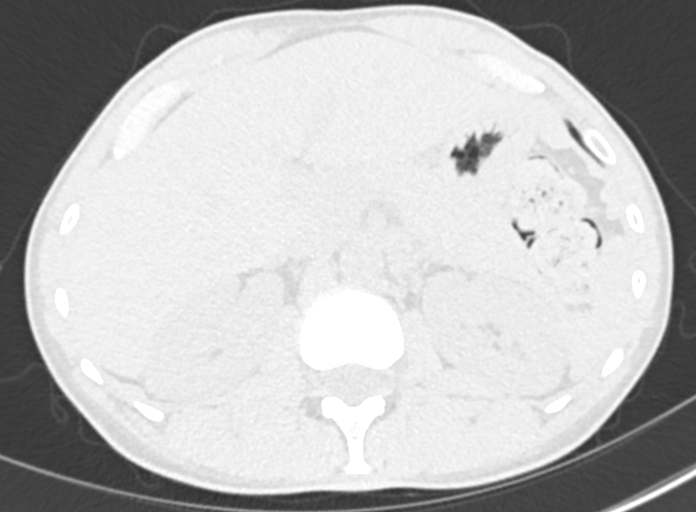
[im 34/184  lung]
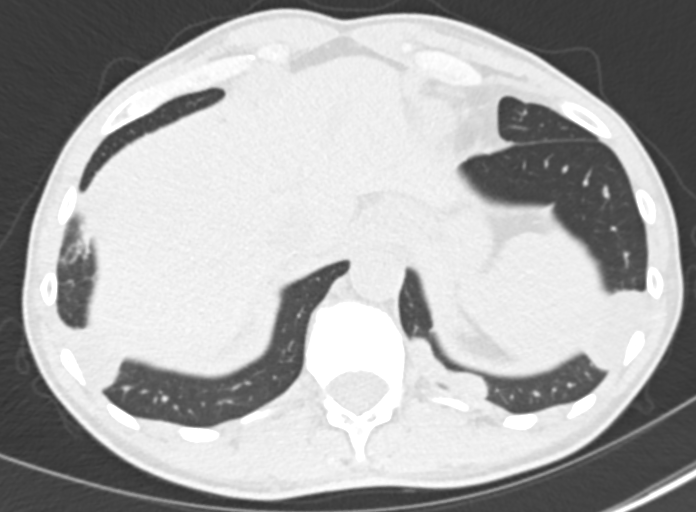
[im 62/184  lung]
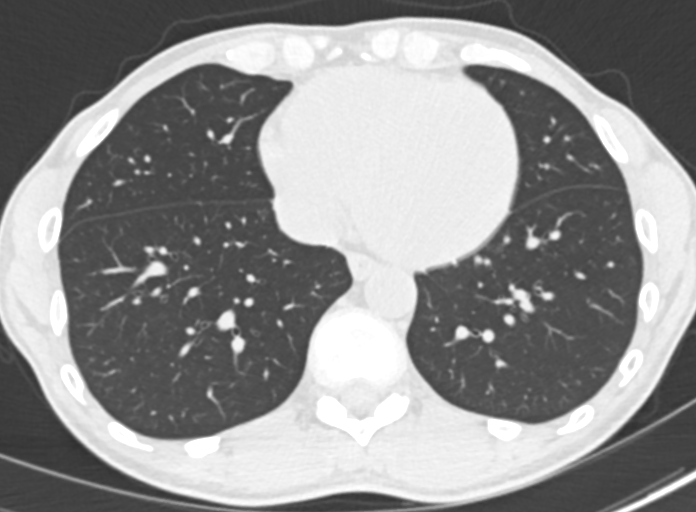
[im 82/184  lung]
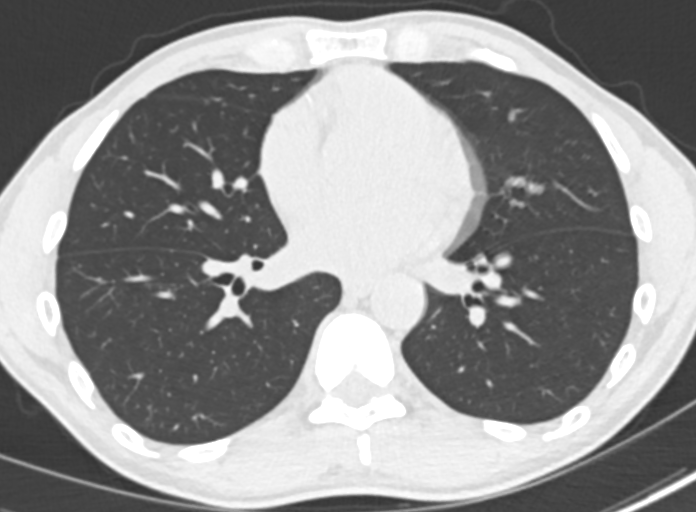
[im 102/184  mediastinal]
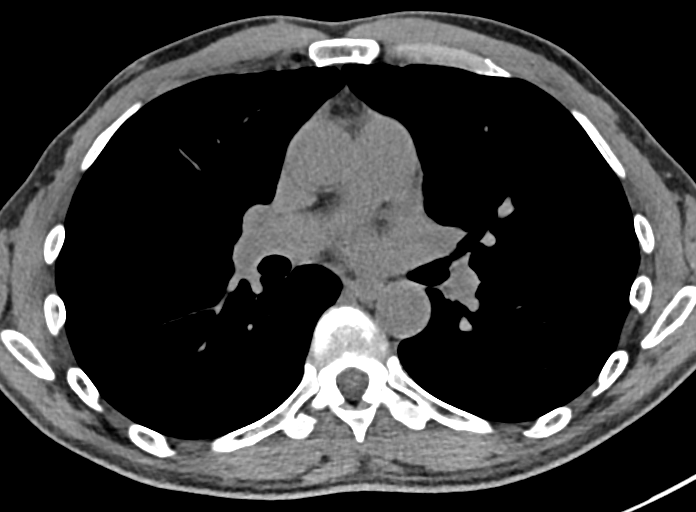
[im 102/184  lung]
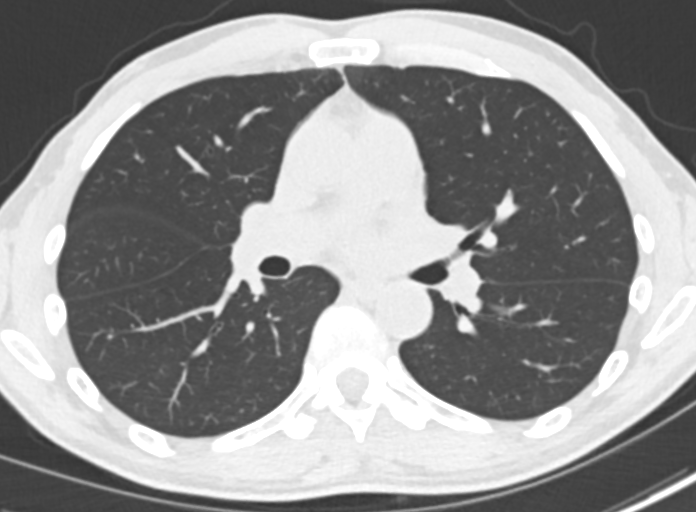
[im 123/184  lung]
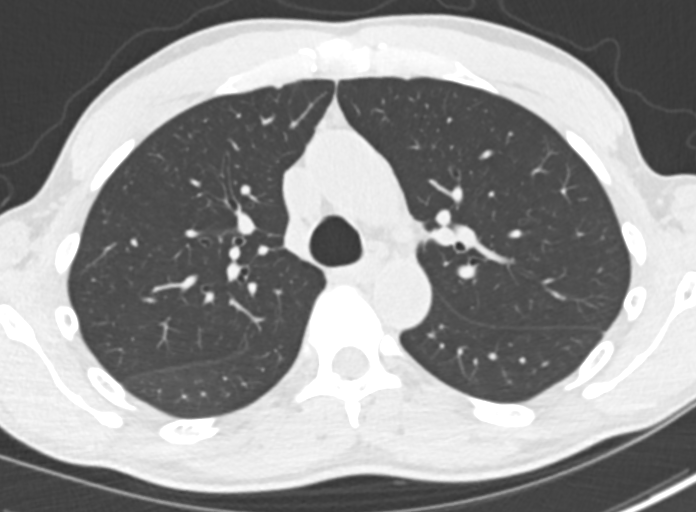
[im 150/184  lung]
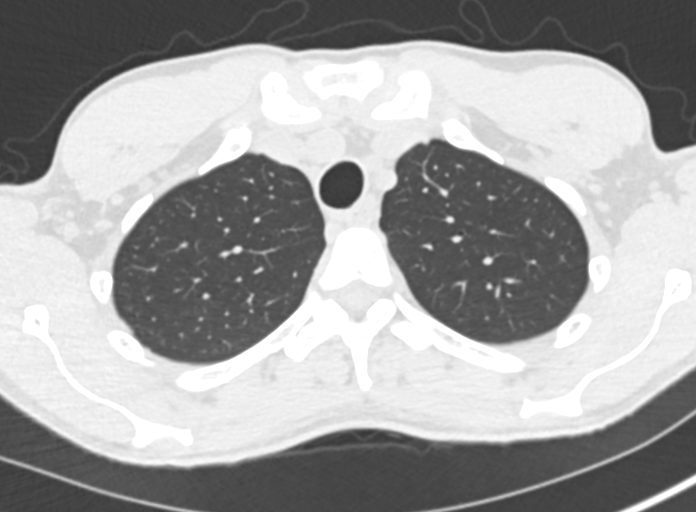
[im 170/184  lung]
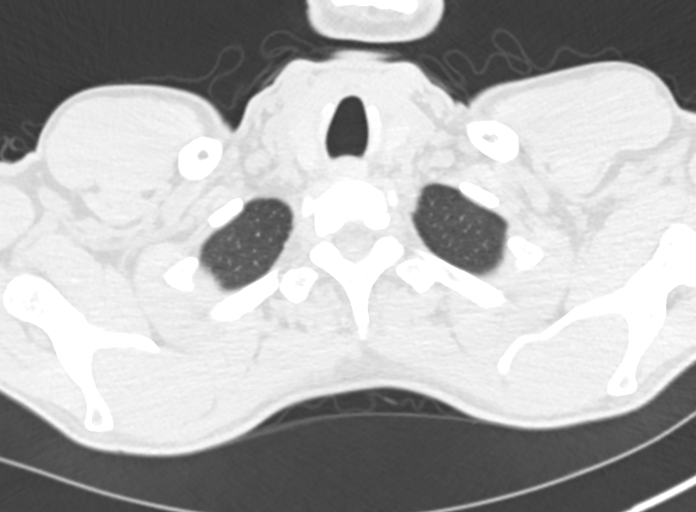

[Series 4: chest 2.00 br40 s3 cor · coronal · 0.63mm/px · 3 of 118 slices shown]
[im 24/118  lung]
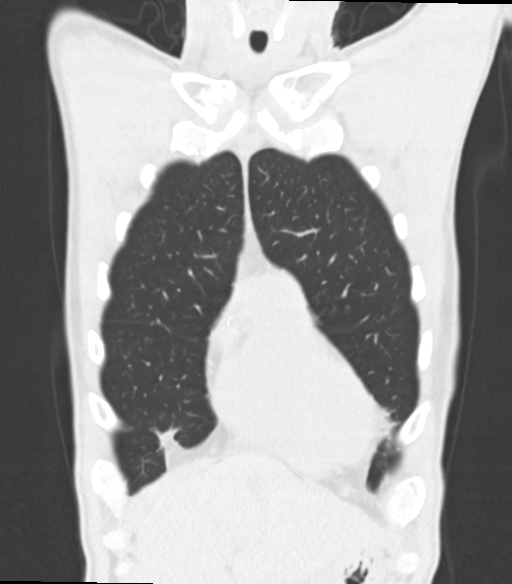
[im 47/118  lung]
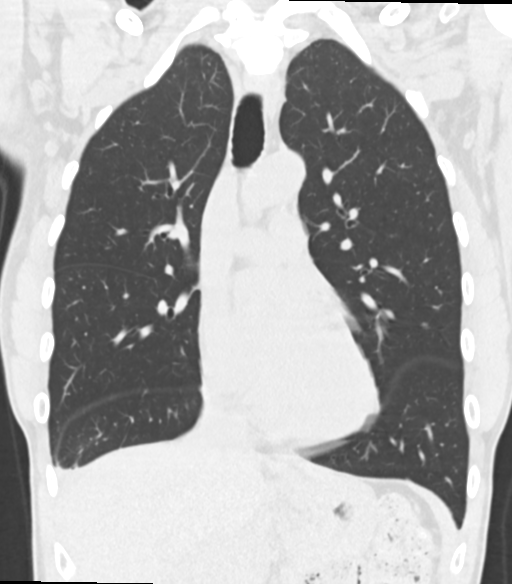
[im 71/118  lung]
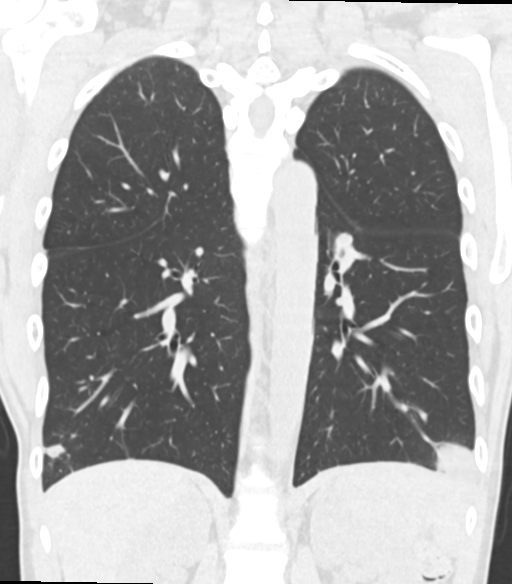

[11 of 36 positions shown; findings below may reference images not displayed]

FINDINGS: Cardiovascular: Coronary artery calcifications.  Normal sized heart.

Mediastinum/Nodes: No enlarged mediastinal or axillary lymph nodes.
Thyroid gland, trachea, and esophagus demonstrate no significant
findings.

Lungs/Pleura: Oval area dense peripheral consolidation and possible
liquefaction in the posterolateral right lower lobe, measuring 5.1 x
2.0 cm on image number 145 series 8.

Similar area with some central gas or air in the posterolateral
aspect of the left lower lobe measuring 4.0 x 2.4 cm on image number
148 series 8.

Similar area posteromedially in the left lower lobe, measuring 4.4 x
0.9 cm on image number 152 series 8.

Small similar area posteriorly at the left lung base in the left
lower lobe on image number 158, measuring 1.2 x 0.6 cm.

Area of probable subsegmental atelectasis in the medial aspect of
the right middle lobe and similar area along the major fissure in
the right lower lobe on image number 142 series 8.

4 mm right lower lobe nodule on image number 120 series 8.

Upper Abdomen: Unremarkable.

Musculoskeletal: Unremarkable bones.
IMPRESSION: 1. Oval area of dense consolidation and possible liquefaction in the
posterolateral right lower lobe and 3 similar areas in the left
lower lobe. These are concerning for areas of pulmonary infarction.
Multifocal infection is also a possibility. These do not have the
cavitation typically seen with septic emboli. Areas of rounded
atelectasis are unlikely since these are multiple. Recommend a chest
CTA to exclude pulmonary emboli.
2. 4 mm right lower lobe nodule. No follow-up needed if patient is
low-risk. Non-contrast chest CT can be considered in 12 months if
patient is high-risk. This recommendation follows the consensus
statement: Guidelines for Management of Incidental Pulmonary Nodules
Detected on CT Images: From the [HOSPITAL] 1666; Radiology
1666; [DATE].
3. Atheromatous coronary artery calcifications.

These results will be called to the ordering clinician or
representative by the Radiologist Assistant, and communication
documented in the PACS or zVision Dashboard.

## 2019-08-24 ENCOUNTER — Other Ambulatory Visit: Payer: Self-pay | Admitting: Cardiology

## 2019-08-24 DIAGNOSIS — R931 Abnormal findings on diagnostic imaging of heart and coronary circulation: Secondary | ICD-10-CM

## 2019-09-27 NOTE — Progress Notes (Signed)
Follow up visit  Subjective:   David Stuart, male    DOB: October 30, 1965, 54 y.o.   MRN: [id number]   HPI  Chief Complaint  Patient presents with  . Coronary Artery Disease    1 YEAR  . Follow-up    54 y.o. African American male with h/o provoked DVT/PE 12/2017, mild nonobstructive coronary artery disease, here for 1 year follow up.   Patient underwent coronary CTA for chest pain. CTA showed mild mid RCA and mid LAD CAD with calcium score of 269. Nonetheless, his chest pain has improved with initiation of omperazole. He was recommended statin, without Aspirin-given his ongoing use of eliquis for h/o PE.  His DVT/PE had occurred after a long road travel in 2019.   Patient continues to exercise regularly, including 5 miles run every day as well as biking. He denies chest pain, shortness of breath, palpitations, leg edema, orthopnea, PND, TIA/syncope.  Patient is moving to [place] on July 21, as he has taken up a position at [employer].  Current Outpatient Medications on File Prior to Visit  Medication Sig Dispense Refill  . apixaban (ELIQUIS) 5 MG TABS tablet Take 1 tablet (5 mg total) by mouth 2 (two) times daily. 60 tablet 0  . omeprazole (PRILOSEC) 40 MG capsule Take 40 mg by mouth daily.    . rosuvastatin (CRESTOR) 5 MG tablet TAKE 1 TABLET BY MOUTH EVERY DAY 90 tablet 2   No current facility-administered medications on file prior to visit.    Cardiovascular & other pertient studies:  EKG 09/28/2019: Sinus rhythm 59 bpm. Probable LVH ST elevation and T wave inversion anteroseptal leads, more prominent compared to previous EKG 10/2018.  CT Chest 06/30/2019: 1. Stable 4 mm nodule seen in right lower lobe. This can be considered benign at this point with no further follow-up required. 2. Mild coronary artery calcifications are noted suggesting coronary artery disease. 3. Stable bibasilar scarring is noted.   CTA 11/16/2018: 1. Coronary calcium score of 269. This  was 98th percentile for age and sex matched control. 2. Normal coronary origin with right dominance. 3. Mild atherosclerosis of the mid RCA 25-49% stenosis CAD-RADs 2. 4. Minimal Calcified plaque in the mid LAD. 5. Recommend aggressive risk factor modification.   Echocardiogram 12/2017: Mod LVH. Normal EF. Mild mitral regurgitation.    Recent labs:  11/04/2018: Chol 134, TG 53, HDL 54, LDL 69  09/08/20219: Glucose 89, BUN/Cr 13/0.79. EGFR 60. Na/K 139/4.1. Rest of the CMP normal H/H 9.8/32. MCV 75. Platelets 256  04/2017: TSH 1.48 normal    Review of Systems  Cardiovascular: Negative for chest pain, dyspnea on exertion, leg swelling, palpitations and syncope.         Vitals:   09/28/19 0830  BP: 99/63  Pulse: (!) 57  Resp: 16  SpO2: 100%     Body mass index is 20.81 kg/m. Filed Weights   09/28/19 0830  Weight: 171 lb (77.6 kg)     Objective:   Physical Exam  Constitutional: No distress.  Neck: No JVD present.  Cardiovascular: Normal rate, regular rhythm, normal heart sounds and intact distal pulses.  No murmur heard. Pulmonary/Chest: Effort normal and breath sounds normal. He has no wheezes. He has no rales.  Musculoskeletal:        General: No edema.  Nursing note and vitals reviewed.         Assessment & Recommendations:   54 y.o. African American male with h/o provoked DVT/PE 12/2017, mild nonobstructive coronary  artery disease, abnormal EKG  H/o DVT/PE: Provoked after long road travel. (2019) Reasonable to stop anticoagulation.  Nonobstructive CAD: Recommend risk factor modification. Start Aspirin 81 mg. Continue rosuvastatin 5 mg daily. Continue regular exercise. Check lipid panel, lopoprotein (a)  Abnormal EKG: There has been progression of abnormal antenroseptal ST-T appearance since 2019. In absence of personal or family h/o sudden death/syncope, suspicion of Brugada syndrome is low. While mod LVH on echocardiogram in the past  can be partly explained by athletic lifestlye, hypertrophic cardiomyopathy remains in the differential. Will obtain cardiac MRI for risk stratification.   He will establish care with cardiologist in [place]. Will transfer records when requestd.    Nigel Mormon, MD [facility] [place] Pager: [phone] Office: [phone]

## 2019-09-28 ENCOUNTER — Encounter: Payer: Self-pay | Admitting: Cardiology

## 2019-09-28 ENCOUNTER — Other Ambulatory Visit: Payer: Self-pay

## 2019-09-28 ENCOUNTER — Ambulatory Visit: Payer: BC Managed Care – PPO | Admitting: Cardiology

## 2019-09-28 VITALS — BP 99/63 | HR 57 | Resp 16 | Ht 76.0 in | Wt 171.0 lb

## 2019-09-28 DIAGNOSIS — R931 Abnormal findings on diagnostic imaging of heart and coronary circulation: Secondary | ICD-10-CM | POA: Diagnosis not present

## 2019-09-28 DIAGNOSIS — R9431 Abnormal electrocardiogram [ECG] [EKG]: Secondary | ICD-10-CM

## 2019-09-28 DIAGNOSIS — I2510 Atherosclerotic heart disease of native coronary artery without angina pectoris: Secondary | ICD-10-CM | POA: Diagnosis not present

## 2019-09-28 MED ORDER — ASPIRIN EC 81 MG PO TBEC: 81.0000 mg | DELAYED_RELEASE_TABLET | Freq: Every day | ORAL | 3 refills | Status: AC

## 2019-09-29 ENCOUNTER — Telehealth (HOSPITAL_COMMUNITY): Payer: Self-pay | Admitting: *Deleted

## 2019-09-29 ENCOUNTER — Telehealth: Payer: Self-pay | Admitting: *Deleted

## 2019-09-29 LAB — LIPID PANEL
Chol/HDL Ratio: 2.2 ratio (ref 0.0–5.0)
Cholesterol, Total: 135 mg/dL (ref 100–199)
HDL: 61 mg/dL (ref >39)
LDL Chol Calc (NIH): 63 mg/dL (ref 0–99)
Triglycerides: 46 mg/dL (ref 0–149)
VLDL Cholesterol Cal: 11 mg/dL (ref 5–40)

## 2019-09-29 LAB — LIPOPROTEIN A (LPA): Lipoprotein (a): 94 nmol/L — ABNORMAL HIGH (ref <75.0)

## 2019-09-29 NOTE — Telephone Encounter (Signed)
Attempted to call patient regarding upcoming cardiac MRI appointment. Left message on voicemail with name and callback number  Brandon RN Navigator Cardiac [facility] [phone] Office [phone] Cell

## 2019-09-29 NOTE — Telephone Encounter (Signed)
Spoke with patient regarding appointment for Cardiac MRI scheduled Monday 10/02/19 at 9:00 am at [facility]---arrival time is 8:15 am 1st floor admissions office.  Patient voiced his understanding.

## 2019-10-02 ENCOUNTER — Other Ambulatory Visit: Payer: Self-pay

## 2019-10-02 ENCOUNTER — Ambulatory Visit (HOSPITAL_COMMUNITY)
Admission: RE | Admit: 2019-10-02 | Discharge: 2019-10-02 | Disposition: A | Discharge status: Home / Self Care | Payer: BC Managed Care – PPO | Source: Ambulatory Visit | Attending: Cardiology | Admitting: Cardiology

## 2019-10-02 DIAGNOSIS — R9431 Abnormal electrocardiogram [ECG] [EKG]: Secondary | ICD-10-CM | POA: Diagnosis not present

## 2019-10-02 MED ORDER — GADOBUTROL 1 MMOL/ML IV SOLN
10.0000 mL | Freq: Once | INTRAVENOUS | Status: AC | PRN
  Administered 2019-10-02: 10 mL via INTRAVENOUS

## 2019-10-03 DIAGNOSIS — R911 Solitary pulmonary nodule: Secondary | ICD-10-CM | POA: Diagnosis not present

## 2019-10-03 DIAGNOSIS — I2510 Atherosclerotic heart disease of native coronary artery without angina pectoris: Secondary | ICD-10-CM | POA: Diagnosis not present

## 2019-10-03 DIAGNOSIS — D709 Neutropenia, unspecified: Secondary | ICD-10-CM | POA: Diagnosis not present

## 2019-10-03 DIAGNOSIS — Z86711 Personal history of pulmonary embolism: Secondary | ICD-10-CM | POA: Diagnosis not present

## 2019-10-03 DIAGNOSIS — Z125 Encounter for screening for malignant neoplasm of prostate: Secondary | ICD-10-CM | POA: Diagnosis not present

## 2019-10-05 ENCOUNTER — Other Ambulatory Visit: Payer: Self-pay | Admitting: Cardiology

## 2019-10-05 ENCOUNTER — Telehealth: Payer: Self-pay

## 2019-10-05 DIAGNOSIS — E7841 Elevated Lipoprotein(a): Secondary | ICD-10-CM

## 2019-10-05 NOTE — Telephone Encounter (Signed)
Gave patient results. He would like to discuss elevated Lipoprotein.  He verbalized understanding.

## 2019-10-05 NOTE — Telephone Encounter (Signed)
Spoke with the patient. He would like to continue follow up with me. Recommend repeat lipoprotein a check and OV in 1 year.

## 2019-10-05 NOTE — Telephone Encounter (Signed)
-----   Message from [facility], MD sent at 10/05/2019  5:34 AM EDT ----- Please tell the patient that I tried calling him yesterday. His cardiac MRI is normal and there is no evidence of any cardiomyopathy. Cholesterol itself is normal. Lipoprotein a, which is a specific lipid particle that increases risk of coronary artery disease, is elevated. I will be happy to discuss more re: this over the phone.  Thanks MJP

## 2019-12-21 ENCOUNTER — Ambulatory Visit: Payer: BC Managed Care – PPO | Admitting: Cardiology

## 2020-06-07 ENCOUNTER — Other Ambulatory Visit: Payer: Self-pay | Admitting: Cardiology

## 2020-06-07 DIAGNOSIS — R931 Abnormal findings on diagnostic imaging of heart and coronary circulation: Secondary | ICD-10-CM

## 2020-10-04 ENCOUNTER — Ambulatory Visit: Payer: Self-pay | Admitting: Cardiology

## 2021-05-07 IMAGING — MR MR CARD MORPHOLOGY WO/W CM
44 of 48 series · 44 of 48 positions shown · IV contrast (gadavist)
Comparison: CT coronary angiogram 11/16/2018

EXAM:
CARDIAC MRI

CLINICAL DATA: Query hypertrophy cardiomyopathy in setting of
abnormal ST-T wave segments on ECG
TECHNIQUE: The patient was scanned on a 1.5 Tesla GE magnet. A dedicated
cardiac coil was used. Functional imaging was done using [employer]
sequences. [DATE], and 4 chamber views were done to assess for RWMA's.
Modified Barcenas rule using a short axis stack was used to
calculate an ejection fraction on a dedicated work station using
[employer] software. The patient received 10mL GADAVIST GADOBUTROL 1
MMOL/ML IV SOLN. After 10 minutes inversion recovery sequences were
used to assess for infiltration and scar tissue.

CONTRAST:  10mL GADAVIST GADOBUTROL 1 MMOL/ML IV SOLN

[Series 4: t2_haste_db_tra_bh · axial · 8.0mm · 1.33mm/px · 1 of 16 slices shown]
[im 1/16]
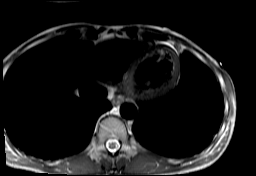

[Series 10: bSSFP · oblique · 8.0mm · 1.61mm/px · 1 of 25 slices shown (1 of 25)]
[im 1/25]
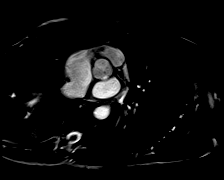

[Series 11: bSSFP · oblique · 8.0mm · 1.61mm/px · 1 of 25 slices shown (2 of 25)]
[im 1/25]
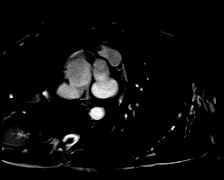

[Series 12: bSSFP · oblique · 8.0mm · 1.61mm/px · 1 of 25 slices shown (3 of 25)]
[im 1/25]
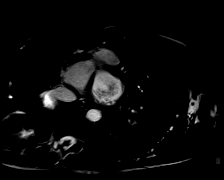

[Series 13: bSSFP · oblique · 8.0mm · 1.61mm/px · 1 of 25 slices shown (4 of 25)]
[im 1/25]
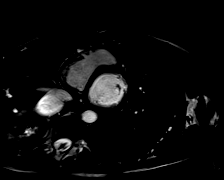

[Series 14: bSSFP · oblique · 8.0mm · 1.61mm/px · 1 of 25 slices shown (5 of 25)]
[im 1/25]
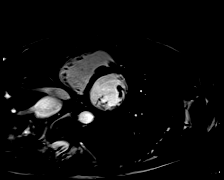

[Series 15: bSSFP · oblique · 8.0mm · 1.61mm/px · 1 of 25 slices shown (6 of 25)]
[im 1/25]
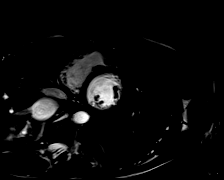

[Series 16: bSSFP · oblique · 8.0mm · 1.61mm/px · 1 of 25 slices shown (7 of 25)]
[im 1/25]
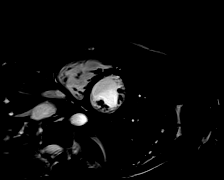

[Series 17: bSSFP · oblique · 8.0mm · 1.61mm/px · 1 of 25 slices shown (8 of 25)]
[im 1/25]
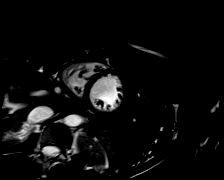

[Series 18: bSSFP · oblique · 8.0mm · 1.61mm/px · 1 of 25 slices shown (9 of 25)]
[im 1/25]
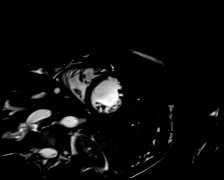

[Series 19: bSSFP · oblique · 8.0mm · 1.61mm/px · 1 of 25 slices shown (10 of 25)]
[im 1/25]
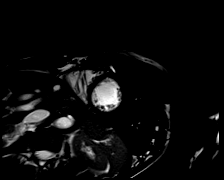

[Series 20: bSSFP · oblique · 8.0mm · 1.61mm/px · 1 of 25 slices shown (11 of 25)]
[im 1/25]
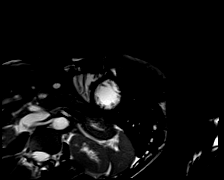

[Series 21: bSSFP · oblique · 8.0mm · 1.61mm/px · 1 of 25 slices shown (12 of 25)]
[im 1/25]
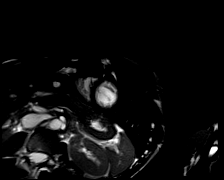

[Series 22: bSSFP · oblique · 8.0mm · 1.61mm/px · 1 of 25 slices shown (13 of 25)]
[im 1/25]
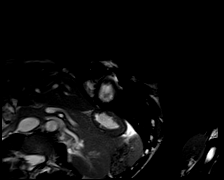

[Series 23: bSSFP · oblique · 8.0mm · 1.61mm/px · 1 of 25 slices shown (14 of 25)]
[im 1/25]
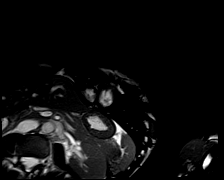

[Series 24: bSSFP · oblique · 8.0mm · 1.61mm/px · 1 of 25 slices shown (15 of 25)]
[im 1/25]
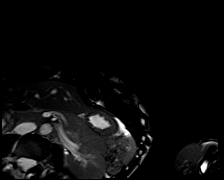

[Series 25: bSSFP · oblique · 8.0mm · 1.61mm/px · 1 of 25 slices shown (16 of 25)]
[im 1/25]
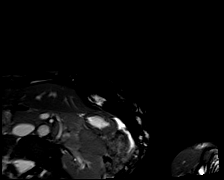

[Series 26: bSSFP · oblique · 8.0mm · 1.61mm/px · 1 of 25 slices shown (17 of 25)]
[im 1/25]
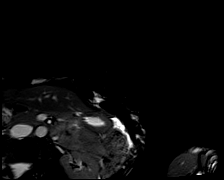

[Series 27: bSSFP · oblique · 8.0mm · 1.61mm/px · 1 of 25 slices shown (18 of 25)]
[im 1/25]
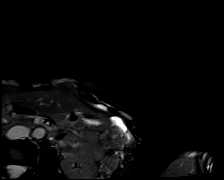

[Series 28: bSSFP · oblique · 8.0mm · 1.61mm/px · 1 of 25 slices shown (19 of 25)]
[im 1/25]
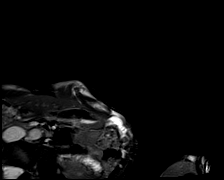

[Series 29: (id)_long_t1 · axial · 8.0mm · 1.41mm/px · 1 of 24 slices shown]
[im 1/24]
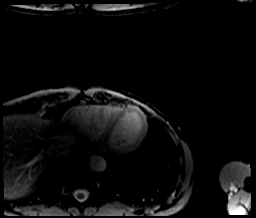

[Series 30: (id)_long_t1_moco · axial · 8.0mm · 1.41mm/px · 1 of 24 slices shown]
[im 1/24]
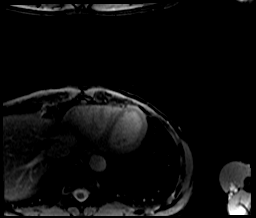

[Series 31: (id)_long_t1_moco_t1 · axial · 8.0mm · 1.41mm/px · 1 of 6 slices shown]
[im 1/6]
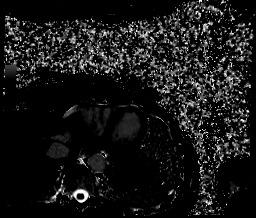

[Series 33: (id)_trufi · axial · 8.0mm · 1.88mm/px · 1 of 9 slices shown]
[im 1/9]
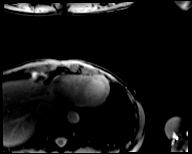

[Series 34: (id)_trufi_moco · axial · 8.0mm · 1.88mm/px · 1 of 9 slices shown]
[im 1/9]
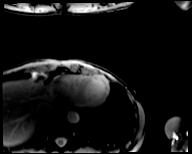

[Series 35: (id)_trufi_moco_t2 · axial · 8.0mm · 1.88mm/px · 1 of 3 slices shown]
[im 1/3]
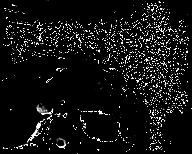

[Series 37: bSSFP · axial · 6.0mm · 1.41mm/px · 1 of 25 slices shown (20 of 25)]
[im 1/25]
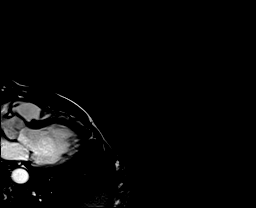

[Series 38: bSSFP · sagittal · 6.0mm · 1.41mm/px · 1 of 25 slices shown (21 of 25)]
[im 1/25]
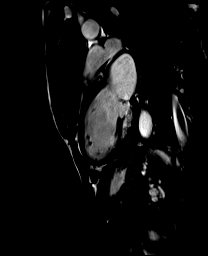

[Series 39: bSSFP · axial · 6.0mm · 1.41mm/px · 1 of 25 slices shown (22 of 25)]
[im 1/25]
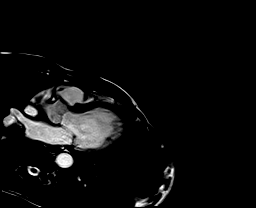

[Series 40: bSSFP · oblique · 6.0mm · 1.41mm/px · 1 of 25 slices shown (23 of 25)]
[im 1/25]
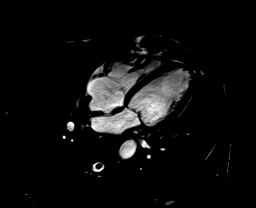

[Series 41: STIR · oblique · 8.0mm · 1.73mm/px · 1 of 15 slices shown]
[im 1/15]
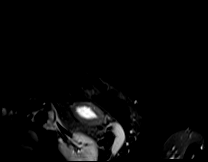

[Series 42: bSSFP · coronal · 6.0mm · 1.41mm/px · 1 of 25 slices shown (24 of 25)]
[im 1/25]
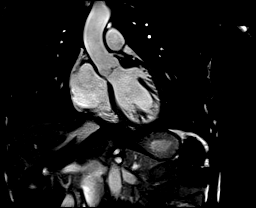

[Series 43: cine rvit · coronal · 6.0mm · 1.41mm/px · 1 of 25 slices shown]
[im 1/25]
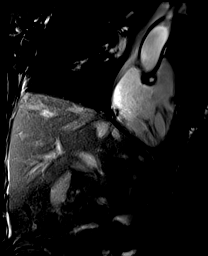

[Series 45: cine rvot · sagittal · 6.0mm · 1.41mm/px · 1 of 25 slices shown]
[im 1/25]
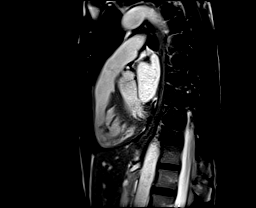

[Series 48: lge_single shot sa · oblique · 8.0mm · 1.98mm/px · 1 of 10 slices shown (1 of 2)]
[im 1/10]
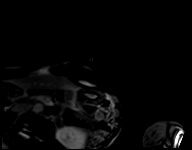

[Series 49: lge_single shot sa · oblique · 8.0mm · 1.98mm/px · 1 of 10 slices shown (2 of 2)]
[im 1/10]
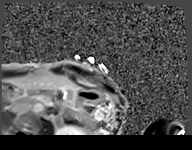

[Series 55: lge_single shot 3 · axial · 6.0mm · 1.98mm/px · 1 of 1 slices shown (1 of 4)]
[im 1/1]
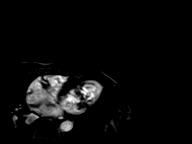

[Series 56: lge_single shot 3 · axial · 6.0mm · 1.98mm/px · 1 of 1 slices shown (2 of 4)]
[im 1/1]
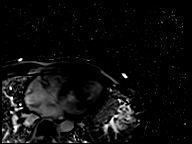

[Series 57: (id)_short_t1 · axial · 8.0mm · 1.41mm/px · 1 of 27 slices shown]
[im 1/27]
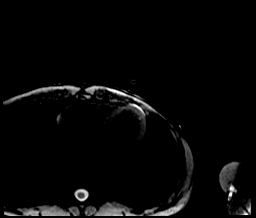

[Series 58: (id)_short_t1_moco · axial · 8.0mm · 1.41mm/px · 1 of 27 slices shown]
[im 1/27]
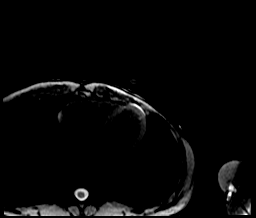

[Series 59: (id)_short_t1_moco_t1 · axial · 8.0mm · 1.41mm/px · 1 of 6 slices shown]
[im 1/6]
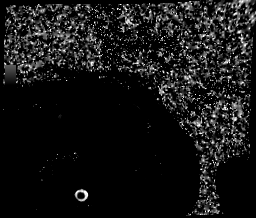

[Series 62: lge_single shot 3 · axial · 6.0mm · 1.98mm/px · 1 of 1 slices shown (3 of 4)]
[im 1/1]
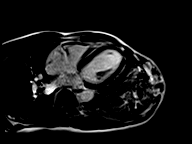

[Series 63: lge_single shot 3 · axial · 6.0mm · 1.98mm/px · 1 of 1 slices shown (4 of 4)]
[im 1/1]
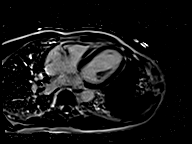

[Series 65: bSSFP · oblique · 6.0mm · 1.73mm/px · 1 of 15 slices shown (25 of 25)]
[im 1/15]
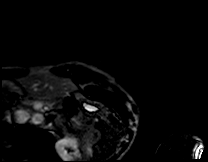

[44 of 48 positions shown; findings below may reference images not displayed]

FINDINGS: LEFT VENTRICLE:

Borderline dilated left ventricular chamber size . Normal wall
thickness. Borderline normal systolic function, LVEF =53%. There are
no regional wall motion abnormalities.

There is no late gadolinium enhancement in the left ventricular
myocardium.

Normal T1 myocardial nulling kinetics suggest against an
infiltrative myocardial process.

ECV = 25%

Maximal wall thickness: 9 mm

Location: Lateral wall

There is no systolic anterior motion of the mitral valve. No flow
dephasing to suggests left ventricular outflow tract obstruction.
Mitral regurgitation is not seen.

No evidence of left ventricular apical aneurysm.

Findings are not consistent with hypertrophic cardiomyopathy.

RIGHT VENTRICLE:

Normal right ventricular size, thickness and systolic function (RVEF
= 47%). There are no regional wall motion abnormalities. No
dyskinetic or anerysmal segments, no findings to suggest
arrhythmogenic right ventricular cardiomyopathy.

ATRIA:

Normal left and right atrial size.

VALVES:

No significant valvular abnormalities.  Tricuspid aortic valve.

PERICARDIUM:

Normal pericardium.  No pericardial effusion.

OTHER: No significant extra cardiac findings noted.

MEASUREMENTS:

LV male

LV EF: 53 % (Normal 56-78%)

Absolute volumes:

LV EDV: 203 mL (Normal 77-195 mL)

LV ESV: 95 mL (Normal 19-72 mL)

LV SV: 107 mL (Normal 51-133 mL)

CO: 4.9 L/min (Normal 2.8-8.8 L/min)

Indexed volumes:

LV EDV: 99 mL/sq-m (Normal 47-92 mL/sq-m)

LV ESV: 47 mL/sq-m (Normal 13-30 mL/sq-m)

LV SV: 53 mL/sq-m (Normal 32-62 mL/sq-m)

CI: 2.4 L/min/sq-m (Normal 1.7-4.2 L/min/sq-m)

RV male

RV EF: 47 % (Normal 47-74%)

Absolute volumes:

RV EDV: 192 mL (Normal 88-227 mL)

RV ESV: 101 mL (Normal 23-103 mL)

RV SV: 91 mL (Normal 52-138 mL)

CO: 4.2 L/min (Normal 2.8-8.8 L/min)

Indexed volumes:

RV EDV: 94 mL/sq-m (Normal 55-105 mL/sq-m)

RV ESV: 50 mL/sq-m (Normal 15-43 mL/sq-m)

RV SV: 44 mL/sq-m (Normal 32-64 mL/sq-m)

CI: 2.05  L/min/sq-m (Normal 1.7-4.2 L/min/sq-m)
IMPRESSION: 1. Borderline dilated LV chamber size with borderline normal LV
systolic function, LVEF 53%.

2.  Normal RV chamber size and function.  RVEF 47%.

3. No delayed myocardial enhancement post-contrast. No scar,
fibrosis, infarct, or inflammation in the myocardium.

4. No evidence of hypertrophic cardiomyopathy or other
cardiomyopathic process.

## 2021-06-26 ENCOUNTER — Other Ambulatory Visit: Payer: Self-pay | Admitting: Cardiology

## 2021-06-26 DIAGNOSIS — R931 Abnormal findings on diagnostic imaging of heart and coronary circulation: Secondary | ICD-10-CM

## 2021-06-29 ENCOUNTER — Other Ambulatory Visit: Payer: Self-pay | Admitting: Cardiology

## 2021-06-29 DIAGNOSIS — R931 Abnormal findings on diagnostic imaging of heart and coronary circulation: Secondary | ICD-10-CM

## 2022-12-09 NOTE — Telephone Encounter (Signed)
done
